# Patient Record
Sex: Male | Born: 1955 | Race: White | Hispanic: No | Smoking: Former smoker
Health system: Southern US, Community
[De-identification: ages and names within clinical notes are randomized; demographics above are authoritative.]

## PROBLEM LIST (undated history)

## (undated) DIAGNOSIS — Z973 Presence of spectacles and contact lenses: Secondary | ICD-10-CM

## (undated) DIAGNOSIS — N189 Chronic kidney disease, unspecified: Secondary | ICD-10-CM

## (undated) DIAGNOSIS — J449 Chronic obstructive pulmonary disease, unspecified: Secondary | ICD-10-CM

## (undated) DIAGNOSIS — K219 Gastro-esophageal reflux disease without esophagitis: Secondary | ICD-10-CM

## (undated) HISTORY — PX: KIDNEY STONE SURGERY: SHX686

---

## 1996-01-22 HISTORY — PX: HAND SURGERY: SHX662

## 2003-01-22 HISTORY — PX: KNEE ARTHROSCOPY: SUR90

## 2006-02-13 ENCOUNTER — Ambulatory Visit: Payer: Self-pay | Admitting: Orthopaedic Surgery

## 2007-01-22 HISTORY — PX: COLONOSCOPY: SHX174

## 2007-12-24 ENCOUNTER — Ambulatory Visit: Payer: Self-pay | Admitting: Family Medicine

## 2008-01-22 HISTORY — PX: FOOT NEUROMA SURGERY: SHX646

## 2008-06-30 ENCOUNTER — Ambulatory Visit: Payer: Self-pay | Admitting: Gastroenterology

## 2008-06-30 LAB — HM COLONOSCOPY

## 2009-04-11 ENCOUNTER — Ambulatory Visit: Payer: Self-pay | Admitting: Podiatry

## 2009-04-13 ENCOUNTER — Ambulatory Visit: Payer: Self-pay | Admitting: Podiatry

## 2010-02-19 ENCOUNTER — Ambulatory Visit: Payer: Self-pay

## 2010-05-17 ENCOUNTER — Ambulatory Visit: Payer: Self-pay | Admitting: Family Medicine

## 2011-01-22 HISTORY — PX: CERVICAL FUSION: SHX112

## 2011-01-25 ENCOUNTER — Other Ambulatory Visit: Payer: Self-pay | Admitting: Neurosurgery

## 2011-02-05 ENCOUNTER — Encounter (HOSPITAL_COMMUNITY): Payer: Self-pay | Admitting: Pharmacy Technician

## 2011-02-12 NOTE — Pre-Procedure Instructions (Addendum)
[address] Jacinto Keil  02/12/2011   Your procedure is scheduled on: 1.29.13  Report to [facility] at 530 AM.  Call this number if you have problems the morning of surgery: [phone]   Remember:   Do not eat food:After Midnight.  May have clear liquids: up to 4 Hours before arrival.  Clear liquids include soda, tea, black coffee, apple or grape juice, broth.  Take these medicines the morning of surgery with A SIP OF WATER: pepcid   Do not wear jewelry, make-up or nail polish.  Do not wear lotions, powders, or perfumes. You may wear deodorant.  Do not shave 48 hours prior to surgery.  Do not bring valuables to the hospital.  Contacts, dentures or bridgework may not be worn into surgery.  Leave suitcase in the car. After surgery it may be brought to your room.  For patients admitted to the hospital, checkout time is 11:00 AM the day of discharge.   Patients discharged the day of surgery will not be allowed to drive home.  Name and phone number of your driver:tamia [phone] fiance   Special Instructions: CHG Shower Use Special Wash: 1/2 bottle night before surgery and 1/2 bottle morning of surgery.   Please read over the following fact sheets that you were given: Pain Booklet, Coughing and Deep Breathing, MRSA Information and Surgical Site Infection Prevention

## 2011-02-13 ENCOUNTER — Encounter (HOSPITAL_COMMUNITY): Payer: Self-pay

## 2011-02-13 ENCOUNTER — Encounter (HOSPITAL_COMMUNITY)
Admission: RE | Admit: 2011-02-13 | Discharge: 2011-02-13 | Disposition: A | Discharge status: Home / Self Care | Payer: BC Managed Care – PPO | Source: Ambulatory Visit | Attending: Neurosurgery | Admitting: Neurosurgery

## 2011-02-13 HISTORY — DX: Chronic kidney disease, unspecified: N18.9

## 2011-02-13 HISTORY — DX: Gastro-esophageal reflux disease without esophagitis: K21.9

## 2011-02-13 HISTORY — DX: Chronic obstructive pulmonary disease, unspecified: J44.9

## 2011-02-13 LAB — SURGICAL PCR SCREEN: MRSA, PCR: NEGATIVE

## 2011-02-13 LAB — CBC
HCT: 45 % (ref 39.0–52.0)
Hemoglobin: 16.1 g/dL (ref 13.0–17.0)
RBC: 5.14 MIL/uL (ref 4.22–5.81)
WBC: 5.6 10*3/uL (ref 4.0–10.5)

## 2011-02-13 LAB — BASIC METABOLIC PANEL
Chloride: 104 mEq/L (ref 96–112)
GFR calc Af Amer: >90 mL/min (ref >90)
GFR calc non Af Amer: 84 mL/min — ABNORMAL LOW (ref >90)
Potassium: 4.1 mEq/L (ref 3.5–5.1)
Sodium: 140 mEq/L (ref 135–145)

## 2011-02-18 MED ORDER — CEFAZOLIN SODIUM-DEXTROSE 2-3 GM-% IV SOLR
2.0000 g | INTRAVENOUS | Status: AC
  Administered 2011-02-19: 2 g via INTRAVENOUS
  Filled 2011-02-18: qty 50

## 2011-02-19 ENCOUNTER — Inpatient Hospital Stay (HOSPITAL_COMMUNITY)
Admission: RE | Admit: 2011-02-19 | Discharge: 2011-02-21 | DRG: 864 | Disposition: A | Discharge status: Home / Self Care | Payer: BC Managed Care – PPO | Source: Ambulatory Visit | Attending: Neurosurgery | Admitting: Neurosurgery

## 2011-02-19 ENCOUNTER — Encounter (HOSPITAL_COMMUNITY)
Admission: RE | Disposition: A | Discharge status: Home / Self Care | Payer: Self-pay | Source: Ambulatory Visit | Attending: Neurosurgery

## 2011-02-19 ENCOUNTER — Ambulatory Visit (HOSPITAL_COMMUNITY): Payer: BC Managed Care – PPO

## 2011-02-19 ENCOUNTER — Encounter (HOSPITAL_COMMUNITY): Payer: Self-pay | Admitting: Anesthesiology

## 2011-02-19 ENCOUNTER — Ambulatory Visit (HOSPITAL_COMMUNITY): Payer: BC Managed Care – PPO | Admitting: Anesthesiology

## 2011-02-19 ENCOUNTER — Encounter (HOSPITAL_COMMUNITY): Payer: Self-pay | Admitting: *Deleted

## 2011-02-19 DIAGNOSIS — M5000 Cervical disc disorder with myelopathy, unspecified cervical region: Principal | ICD-10-CM | POA: Diagnosis present

## 2011-02-19 DIAGNOSIS — Z79899 Other long term (current) drug therapy: Secondary | ICD-10-CM

## 2011-02-19 DIAGNOSIS — Z01812 Encounter for preprocedural laboratory examination: Secondary | ICD-10-CM

## 2011-02-19 DIAGNOSIS — R1310 Dysphagia, unspecified: Secondary | ICD-10-CM | POA: Diagnosis not present

## 2011-02-19 DIAGNOSIS — M4712 Other spondylosis with myelopathy, cervical region: Secondary | ICD-10-CM | POA: Diagnosis present

## 2011-02-19 SURGERY — ANTERIOR CERVICAL DECOMPRESSION/DISCECTOMY FUSION 4 LEVELS
Anesthesia: General

## 2011-02-19 MED ORDER — DEXAMETHASONE SODIUM PHOSPHATE 4 MG/ML IJ SOLN
INTRAMUSCULAR | Status: DC | PRN
  Administered 2011-02-19: 10 mg via INTRAVENOUS

## 2011-02-19 MED ORDER — SODIUM CHLORIDE 0.9 % IV SOLN
INTRAVENOUS | Status: AC
  Filled 2011-02-19: qty 500

## 2011-02-19 MED ORDER — SODIUM CHLORIDE 0.9 % IR SOLN
Status: DC | PRN
  Administered 2011-02-19: 07:00:00

## 2011-02-19 MED ORDER — HYDROMORPHONE HCL PF 1 MG/ML IJ SOLN
INTRAMUSCULAR | Status: AC
  Administered 2011-02-19: 0.5 mg via INTRAVENOUS
  Filled 2011-02-19: qty 1

## 2011-02-19 MED ORDER — MORPHINE SULFATE 2 MG/ML IJ SOLN: 0.0500 mg/kg | INTRAMUSCULAR | Status: DC | PRN

## 2011-02-19 MED ORDER — ONDANSETRON HCL 4 MG/2ML IJ SOLN: 4.0000 mg | Freq: Once | INTRAMUSCULAR | Status: DC | PRN

## 2011-02-19 MED ORDER — BACITRACIN 50000 UNITS IM SOLR
INTRAMUSCULAR | Status: AC
  Administered 2011-02-19: 07:00:00
  Filled 2011-02-19: qty 1

## 2011-02-19 MED ORDER — HYDROCODONE-ACETAMINOPHEN 5-325 MG PO TABS: 1.0000 | ORAL_TABLET | ORAL | Status: DC | PRN

## 2011-02-19 MED ORDER — BUPIVACAINE HCL (PF) 0.25 % IJ SOLN
INTRAMUSCULAR | Status: DC | PRN
  Administered 2011-02-19: 5 mL

## 2011-02-19 MED ORDER — LIDOCAINE-EPINEPHRINE 1 %-1:100000 IJ SOLN
INTRAMUSCULAR | Status: DC | PRN
  Administered 2011-02-19: 5 mL

## 2011-02-19 MED ORDER — CEFAZOLIN SODIUM 1-5 GM-% IV SOLN
1.0000 g | Freq: Three times a day (TID) | INTRAVENOUS | Status: AC
  Administered 2011-02-19 (×2): 1 g via INTRAVENOUS
  Filled 2011-02-19 (×3): qty 50

## 2011-02-19 MED ORDER — DOCUSATE SODIUM 100 MG PO CAPS
100.0000 mg | ORAL_CAPSULE | Freq: Two times a day (BID) | ORAL | Status: DC
  Administered 2011-02-19 – 2011-02-21 (×4): 100 mg via ORAL
  Filled 2011-02-19 (×5): qty 1

## 2011-02-19 MED ORDER — ONDANSETRON HCL 4 MG/2ML IJ SOLN
4.0000 mg | INTRAMUSCULAR | Status: DC | PRN
  Administered 2011-02-19 – 2011-02-20 (×2): 4 mg via INTRAVENOUS
  Filled 2011-02-19 (×2): qty 2

## 2011-02-19 MED ORDER — MIDAZOLAM HCL 5 MG/5ML IJ SOLN
INTRAMUSCULAR | Status: DC | PRN
  Administered 2011-02-19: 2 mg via INTRAVENOUS

## 2011-02-19 MED ORDER — ACETAMINOPHEN 650 MG RE SUPP: 650.0000 mg | RECTAL | Status: DC | PRN

## 2011-02-19 MED ORDER — HYDROMORPHONE HCL PF 1 MG/ML IJ SOLN
0.2500 mg | INTRAMUSCULAR | Status: DC | PRN
  Administered 2011-02-19 (×2): 0.5 mg via INTRAVENOUS

## 2011-02-19 MED ORDER — LACTATED RINGERS IV SOLN
INTRAVENOUS | Status: DC | PRN
  Administered 2011-02-19: 07:00:00 via INTRAVENOUS

## 2011-02-19 MED ORDER — KCL IN DEXTROSE-NACL 20-5-0.45 MEQ/L-%-% IV SOLN
INTRAVENOUS | Status: DC
  Administered 2011-02-19 – 2011-02-20 (×3): via INTRAVENOUS
  Filled 2011-02-19 (×7): qty 1000

## 2011-02-19 MED ORDER — MEPERIDINE HCL 25 MG/ML IJ SOLN: 6.2500 mg | INTRAMUSCULAR | Status: DC | PRN

## 2011-02-19 MED ORDER — GLYCOPYRROLATE 0.2 MG/ML IJ SOLN
INTRAMUSCULAR | Status: DC | PRN
  Administered 2011-02-19: .7 mg via INTRAVENOUS

## 2011-02-19 MED ORDER — SODIUM CHLORIDE 0.9 % IJ SOLN
3.0000 mL | Freq: Two times a day (BID) | INTRAMUSCULAR | Status: DC
  Administered 2011-02-19 – 2011-02-21 (×2): 3 mL via INTRAVENOUS

## 2011-02-19 MED ORDER — MENTHOL 3 MG MT LOZG
1.0000 | LOZENGE | OROMUCOSAL | Status: DC | PRN
  Filled 2011-02-19: qty 9

## 2011-02-19 MED ORDER — SODIUM CHLORIDE 0.9 % IJ SOLN: 3.0000 mL | INTRAMUSCULAR | Status: DC | PRN

## 2011-02-19 MED ORDER — DIAZEPAM 5 MG PO TABS
5.0000 mg | ORAL_TABLET | Freq: Four times a day (QID) | ORAL | Status: DC | PRN
  Administered 2011-02-19 (×2): 5 mg via ORAL
  Filled 2011-02-19 (×2): qty 1

## 2011-02-19 MED ORDER — 0.9 % SODIUM CHLORIDE (POUR BTL) OPTIME
TOPICAL | Status: DC | PRN
  Administered 2011-02-19: 1000 mL

## 2011-02-19 MED ORDER — SUFENTANIL CITRATE 50 MCG/ML IV SOLN
INTRAVENOUS | Status: DC | PRN
  Administered 2011-02-19 (×5): 10 ug via INTRAVENOUS

## 2011-02-19 MED ORDER — SODIUM CHLORIDE 0.9 % IV SOLN: 250.0000 mL | INTRAVENOUS | Status: DC

## 2011-02-19 MED ORDER — MORPHINE SULFATE 4 MG/ML IJ SOLN
1.0000 mg | INTRAMUSCULAR | Status: DC | PRN
  Administered 2011-02-19 – 2011-02-20 (×6): 2 mg via INTRAVENOUS
  Administered 2011-02-20: 4 mg via INTRAVENOUS
  Administered 2011-02-20: 2 mg via INTRAVENOUS
  Filled 2011-02-19 (×7): qty 1

## 2011-02-19 MED ORDER — PROPOFOL 10 MG/ML IV EMUL
INTRAVENOUS | Status: DC | PRN
  Administered 2011-02-19: 200 mg via INTRAVENOUS
  Administered 2011-02-19: 30 mg via INTRAVENOUS

## 2011-02-19 MED ORDER — ACETAMINOPHEN 325 MG PO TABS: 650.0000 mg | ORAL_TABLET | ORAL | Status: DC | PRN

## 2011-02-19 MED ORDER — OXYCODONE-ACETAMINOPHEN 5-325 MG PO TABS
1.0000 | ORAL_TABLET | ORAL | Status: DC | PRN
  Administered 2011-02-19 – 2011-02-20 (×3): 2 via ORAL
  Administered 2011-02-20: 1 via ORAL
  Filled 2011-02-19: qty 2
  Filled 2011-02-19: qty 1
  Filled 2011-02-19 (×2): qty 2

## 2011-02-19 MED ORDER — MORPHINE SULFATE 10 MG/ML IJ SOLN: 0.0500 mg/kg | INTRAMUSCULAR | Status: DC | PRN

## 2011-02-19 MED ORDER — VECURONIUM BROMIDE 10 MG IV SOLR
INTRAVENOUS | Status: DC | PRN
  Administered 2011-02-19: 2 mg via INTRAVENOUS

## 2011-02-19 MED ORDER — NEOSTIGMINE METHYLSULFATE 1 MG/ML IJ SOLN
INTRAMUSCULAR | Status: DC | PRN
  Administered 2011-02-19: 5 mg via INTRAVENOUS

## 2011-02-19 MED ORDER — FAMOTIDINE 20 MG PO TABS
20.0000 mg | ORAL_TABLET | Freq: Once | ORAL | Status: AC
  Administered 2011-02-19: 20 mg via ORAL
  Filled 2011-02-19 (×2): qty 1

## 2011-02-19 MED ORDER — THROMBIN 20000 UNITS EX KIT
PACK | CUTANEOUS | Status: DC | PRN
  Administered 2011-02-19: 20000 [IU] via TOPICAL

## 2011-02-19 MED ORDER — ONDANSETRON HCL 4 MG/2ML IJ SOLN
INTRAMUSCULAR | Status: DC | PRN
  Administered 2011-02-19: 4 mg via INTRAVENOUS

## 2011-02-19 MED ORDER — PHENOL 1.4 % MT LIQD
1.0000 | OROMUCOSAL | Status: DC | PRN
  Filled 2011-02-19: qty 177

## 2011-02-19 MED ORDER — ZOLPIDEM TARTRATE 5 MG PO TABS: 10.0000 mg | ORAL_TABLET | Freq: Every evening | ORAL | Status: DC | PRN

## 2011-02-19 MED ORDER — ROCURONIUM BROMIDE 100 MG/10ML IV SOLN
INTRAVENOUS | Status: DC | PRN
  Administered 2011-02-19: 20 mg via INTRAVENOUS
  Administered 2011-02-19: 10 mg via INTRAVENOUS
  Administered 2011-02-19 (×2): 50 mg via INTRAVENOUS
  Administered 2011-02-19: 10 mg via INTRAVENOUS

## 2011-02-19 SURGICAL SUPPLY — 71 items
ALPHATEC SPINE PLATE 80 MM (Plate) ×2 IMPLANT
BAG DECANTER FOR FLEXI CONT (MISCELLANEOUS) ×2 IMPLANT
BANDAGE GAUZE ELAST BULKY 4 IN (GAUZE/BANDAGES/DRESSINGS) IMPLANT
BENZOIN TINCTURE PRP APPL 2/3 (GAUZE/BANDAGES/DRESSINGS) ×2 IMPLANT
BIT DRILL 14MM (INSTRUMENTS) ×1 IMPLANT
BIT DRILL NEURO 2X3.1 SFT TUCH (MISCELLANEOUS) ×1 IMPLANT
BLADE ULTRA TIP 2M (BLADE) IMPLANT
BLOCK PROFUSE SZ 6 (Bone Implant) ×4 IMPLANT
BUR BARREL STRAIGHT FLUTE 4.0 (BURR) ×2 IMPLANT
CAGE CERVICAL 8 (Cage) ×4 IMPLANT
CANISTER SUCTION 2500CC (MISCELLANEOUS) ×2 IMPLANT
CLOTH BEACON ORANGE TIMEOUT ST (SAFETY) ×2 IMPLANT
CONT SPEC 4OZ CLIKSEAL STRL BL (MISCELLANEOUS) ×2 IMPLANT
COVER MAYO STAND STRL (DRAPES) ×2 IMPLANT
DERMABOND ADVANCED (GAUZE/BANDAGES/DRESSINGS) ×1
DERMABOND ADVANCED .7 DNX12 (GAUZE/BANDAGES/DRESSINGS) ×1 IMPLANT
DRAPE LAPAROTOMY 100X72 PEDS (DRAPES) ×2 IMPLANT
DRAPE MICROSCOPE LEICA (MISCELLANEOUS) ×2 IMPLANT
DRAPE POUCH INSTRU U-SHP 10X18 (DRAPES) ×2 IMPLANT
DRAPE PROXIMA HALF (DRAPES) IMPLANT
DRESSING TELFA 8X3 (GAUZE/BANDAGES/DRESSINGS) ×2 IMPLANT
DRILL 14MM (INSTRUMENTS) ×2
DRILL NEURO 2X3.1 SOFT TOUCH (MISCELLANEOUS) ×2
DURAPREP 6ML APPLICATOR 50/CS (WOUND CARE) ×2 IMPLANT
ELECT COATED BLADE 2.86 ST (ELECTRODE) ×2 IMPLANT
ELECT REM PT RETURN 9FT ADLT (ELECTROSURGICAL) ×2
ELECTRODE REM PT RTRN 9FT ADLT (ELECTROSURGICAL) ×1 IMPLANT
GAUZE SPONGE 4X4 16PLY XRAY LF (GAUZE/BANDAGES/DRESSINGS) ×2 IMPLANT
GLOVE BIO SURGEON STRL SZ8 (GLOVE) ×2 IMPLANT
GLOVE BIOGEL M 8.0 STRL (GLOVE) ×2 IMPLANT
GLOVE BIOGEL PI IND STRL 8 (GLOVE) ×1 IMPLANT
GLOVE BIOGEL PI IND STRL 8.5 (GLOVE) ×2 IMPLANT
GLOVE BIOGEL PI INDICATOR 8 (GLOVE) ×1
GLOVE BIOGEL PI INDICATOR 8.5 (GLOVE) ×2
GLOVE ECLIPSE 7.5 STRL STRAW (GLOVE) ×2 IMPLANT
GLOVE EXAM NITRILE LRG STRL (GLOVE) IMPLANT
GLOVE EXAM NITRILE MD LF STRL (GLOVE) ×2 IMPLANT
GLOVE EXAM NITRILE XL STR (GLOVE) IMPLANT
GLOVE EXAM NITRILE XS STR PU (GLOVE) IMPLANT
GOWN BRE IMP SLV AUR LG STRL (GOWN DISPOSABLE) IMPLANT
GOWN BRE IMP SLV AUR XL STRL (GOWN DISPOSABLE) IMPLANT
GOWN STRL REIN 2XL LVL4 (GOWN DISPOSABLE) IMPLANT
HEAD HALTER (SOFTGOODS) ×2 IMPLANT
KIT BASIN OR (CUSTOM PROCEDURE TRAY) ×2 IMPLANT
KIT ROOM TURNOVER OR (KITS) ×2 IMPLANT
NEEDLE HYPO 18GX1.5 BLUNT FILL (NEEDLE) ×2 IMPLANT
NEEDLE HYPO 25X1 1.5 SAFETY (NEEDLE) ×2 IMPLANT
NEEDLE SPNL 22GX3.5 QUINCKE BK (NEEDLE) ×2 IMPLANT
NS IRRIG 1000ML POUR BTL (IV SOLUTION) ×2 IMPLANT
PACK LAMINECTOMY NEURO (CUSTOM PROCEDURE TRAY) ×2 IMPLANT
PAD ARMBOARD 7.5X6 YLW CONV (MISCELLANEOUS) ×2 IMPLANT
PATTIES SURGICAL .5 X.5 (GAUZE/BANDAGES/DRESSINGS) ×2 IMPLANT
PIN DISTRACTION 14MM (PIN) ×4 IMPLANT
PUREGEN 2.0ML LRG (Bone Implant) ×4 IMPLANT
RUBBERBAND STERILE (MISCELLANEOUS) ×4 IMPLANT
SCREW 14MM (Screw) ×20 IMPLANT
SPACER SPNL 7D LRG 16X14X8XNS (Cage) ×4 IMPLANT
SPCR SPNL 7D LRG 16X14X8XNS (Cage) ×4 IMPLANT
SPONGE GAUZE 4X4 12PLY (GAUZE/BANDAGES/DRESSINGS) ×2 IMPLANT
SPONGE INTESTINAL PEANUT (DISPOSABLE) ×2 IMPLANT
SPONGE SURGIFOAM ABS GEL 100 (HEMOSTASIS) ×2 IMPLANT
STAPLER SKIN PROX WIDE 3.9 (STAPLE) ×2 IMPLANT
STRIP CLOSURE SKIN 1/2X4 (GAUZE/BANDAGES/DRESSINGS) ×2 IMPLANT
SUT VIC AB 3-0 SH 8-18 (SUTURE) ×4 IMPLANT
SYR 20ML ECCENTRIC (SYRINGE) ×2 IMPLANT
SYR 3ML LL SCALE MARK (SYRINGE) ×2 IMPLANT
TOWEL OR 17X24 6PK STRL BLUE (TOWEL DISPOSABLE) ×2 IMPLANT
TOWEL OR 17X26 10 PK STRL BLUE (TOWEL DISPOSABLE) ×2 IMPLANT
TRAP SPECIMEN MUCOUS 40CC (MISCELLANEOUS) ×2 IMPLANT
TRAY FOLEY CATH 14FRSI W/METER (CATHETERS) ×2 IMPLANT
WATER STERILE IRR 1000ML POUR (IV SOLUTION) ×2 IMPLANT

## 2011-02-19 NOTE — Anesthesia Preprocedure Evaluation (Addendum)
Anesthesia Evaluation  Patient identified by MRN, date of birth, ID band Patient awake    Reviewed: Allergy & Precautions, H&P , NPO status , Patient's Chart, lab work & pertinent test results  Airway Mallampati: I TM Distance: >3 FB Neck ROM: Full  Mouth opening: Limited Mouth Opening  Dental  (+) Teeth Intact and Dental Advisory Given   Pulmonary neg pulmonary ROS,  Pt does not have COPD clear to auscultation        Cardiovascular neg cardio ROS Regular Normal    Neuro/Psych    GI/Hepatic GERD-  Medicated and Controlled,  Endo/Other    Renal/GU      Musculoskeletal   Abdominal   Peds  Hematology   Anesthesia Other Findings   Reproductive/Obstetrics                         Anesthesia Physical Anesthesia Plan  ASA: II  Anesthesia Plan: General   Post-op Pain Management:    Induction: Intravenous  Airway Management Planned: Oral ETT  Additional Equipment:   Intra-op Plan:   Post-operative Plan: Extubation in OR  Informed Consent: I have reviewed the patients History and Physical, chart, labs and discussed the procedure including the risks, benefits and alternatives for the proposed anesthesia with the patient or authorized representative who has indicated his/her understanding and acceptance.   Dental advisory given  Plan Discussed with: CRNA, Anesthesiologist and Surgeon  Anesthesia Plan Comments:         Anesthesia Quick Evaluation

## 2011-02-19 NOTE — Transfer of Care (Signed)
Immediate Anesthesia Transfer of Care Note  Patient: Chris Odom  Procedure(s) Performed:  ANTERIOR CERVICAL DECOMPRESSION/DISCECTOMY FUSION 4 LEVELS - CERVICAL THREE-FOUR CERVICAL FOUR-FIVE CERVICAL FIVE-SIX CERVICAL SIX-SEVEN anterior cervical decompression with fusion interbody prothesis plating and bonegraft  Patient Location: PACU  Anesthesia Type: General  Level of Consciousness: sedated  Airway & Oxygen Therapy: Patient Spontanous Breathing and Patient connected to nasal cannula oxygen  Post-op Assessment: Report given to PACU RN, Post -op Vital signs reviewed and stable and Patient moving all extremities X 4  Post vital signs: Reviewed and stable  Complications: No apparent anesthesia complications

## 2011-02-19 NOTE — Interval H&P Note (Signed)
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 After consideration of risks, benefits and other options for treatment, the patient has consented to  Procedure(s): ANTERIOR CERVICAL DECOMPRESSION/DISCECTOMY FUSION 4 LEVELS as a surgical intervention .  The patients' history has been reviewed, patient examined, no change in status, stable for surgery.  I have reviewed the patients' chart and labs.  Questions were answered to the patient's satisfaction.     Jeremey Bascom D  Date of Initial H&P:01/25/2011  History reviewed, patient examined, no change in status, stable for surgery.

## 2011-02-19 NOTE — H&P (Signed)
Chris Odom  #[id number] DOB:  02-26-1955 01/25/2011:  Chris Odom returns today to review his MRI of his cervical spine.  This shows that he has marked spondylosis, biforaminal stenosis, and canal stenosis with cord compression at the C5-6 and C6-7 levels, with retrolisthesis of C3 on C4 and with biforaminal stenosis C3-4 and C4-5 levels, all of which are marked and quite impressive.  He does note persistent numbness into both his hands when he tips his head back and he says that he is having more severe pain than he was previously.    I reviewed in great detail his imaging studies with him and explained that he would need to undergo decompression and fusion at C3 through 7 levels if he were to elect for surgical treatment. I do not think that one level is not severely affected and I think that he would need to have each of these levels treated. We discussed at length the specifics of the surgery, attendant risks and benefits.  He asked why this has happened to him and the one thing of note in his history is that he used to smoke two to three packs a day and did so for many years, and I think this is a likely result of accelerated marked spondylosis.   We went over models. I went over his MRI. I fitted him for a cervical collar and answered his questions in detail.  He wishes to proceed with surgery.          Danae Odom. Venetia Maxon, M.D./aft     NEUROSURGICAL CONSULTATION   Chris Odom  DOB:  02/02/1955 #[id number]    January 04, 2011   HISTORY:     Chris Odom is a 56 year old retired Engineer, maintenance ([employer]) who is currently working at [employer] on a part-time basis, who presents with left arm and shoulder pain.  He describes numbness and tingling into his left arm. He says this has been going on for about six weeks.  It began with left scapular pain about six weeks ago and now it is extending down his left arm along with numbness.  He has been taking three Celebrex a day along with muscle relaxers, which he  says helps him some, but not a great deal.  He has also been taking Advil two a day.  He has had a cyst removed x three from his right hand and left knee x two and neuromas removed from his right foot.  He says that he was initially in a lot of pain, but now the pain has settled into his shoulders and notes tingling and numbness into his arm, and he says he cannot use his arm. He also notes numbness into his upper chest as well and this involves his second through fifth digits.    REVIEW OF SYSTEMS:   A detailed Review of Systems sheet was reviewed with the patient.  Pertinent positives include wears glasses, arm pain and neck pain.  All other systems are negative; this includes Constitutional symptoms, Cardiovascular, Ears, nose, mouth, throat, Endocrine, Respiratory, Gastrointestinal, Genitourinary, Integumentary & Breast, Neurologic, Psychiatric, Hematologic/Lymphatic, Allergic/Immunologic.    PAST MEDICAL HISTORY:      Current Medical Conditions:    As previously described.      Prior Operations and Hospitalizations:   As previously described.      Medications and Allergies:  Medications - Advil two per day.  No known drug allergies.      Height and Weight:     He  is currently 5'11" tall, 198 lbs.    SOCIAL HISTORY:    He denies tobacco, alcohol, or drug use.     DIAGNOSTIC STUDIES:   Chris Odom had cervical radiographs which show left C4-5 stenosis and also at C5-6.    PHYSICAL EXAMINATION:      General Appearance:   On examination today, Chris Odom is a pleasant and cooperative man in no acute distress.      Blood Pressure, Pulse:     His blood pressure is 108/64.  Heart rate is 70 and regular.  Respiratory rate is 16.      HEENT - normocephalic, atraumatic.  The pupils are equal, round and reactive to light.  The extraocular muscles are intact.  Sclerae - white.  Conjunctiva - pink.  Oropharynx benign.  Uvula midline.     Neck - there are no masses, meningismus, deformities, tracheal  deviation, jugular vein distention or carotid bruits.  He has decreased range of motion of his neck with lateral bending and rotation to the left.  He has a positive Spurlings' maneuver to the left, negative to the right.  Negative Lhermitte's sign with axial compression.      Respiratory - there is normal respiratory effort with good intercostal function.  Lungs are clear to auscultation.  There are no rales, rhonchi or wheezes.      Cardiovascular - the heart has regular rate and rhythm to auscultation.  No murmurs are appreciated.  There is no extremity edema, cyanosis or clubbing.  There are palpable pedal pulses.      Abdomen - soft, nontender, no hepatosplenomegaly appreciated or masses.  There are active bowel sounds.  No guarding or rebound.      Musculoskeletal Examination - he has mildly positive shoulder impingement testing on the left, negative on the right.    NEUROLOGICAL EXAMINATION: The patient is oriented to time, person and place and has good recall of both recent and remote memory with normal attention span and concentration.  The patient speaks with clear and fluent speech and exhibits normal language function and appropriate fund of knowledge.      Cranial Nerve Examination - pupils are equal, round and reactive to light.  Extraocular movements are full.  Visual fields are full to confrontational testing.  Facial sensation and facial movement are symmetric and intact.  Hearing is intact to finger rub.  Palate is upgoing.  Shoulder shrug is symmetric.  Tongue protrudes in the midline.      Motor Examination - motor strength is 5/5 in the bilateral deltoids, biceps, handgrips, wrist extensors, interosseous, 5/5 right triceps, left triceps strength at 4/5, and left wrist flexion 4/5.  In the lower extremities motor strength is 5/5 in hip flexion, extension, quadriceps, hamstrings, plantar flexion, dorsiflexion and extensor hallucis longus.      Sensory Examination - he has decreased  pin sensation in the left second and third digits.       Deep Tendon Reflexes - 1 in the biceps, 1 in the right triceps, trace in the left triceps and 2 in the knees, 2 in the ankles.  The great toes are downgoing to plantar stimulation.      Cerebellar Examination - normal coordination in upper and lower extremities and normal rapid alternating movements.  Romberg test is negative.    IMPRESSION AND RECOMMENDATIONS: Edvardo Honse is a 56 year old man with cervical spondylosis and left arm pain and weakness.  He has evidence of a C4 radiculopathy on physical examination.  I have recommended that he undergo an MRI in an expedited fashion of his cervical spine. I will see him back after that has been done and make further recommendations at that point.    [facility]    Danae Odom. Venetia Maxon, M.D.  JDS:aft

## 2011-02-19 NOTE — Anesthesia Postprocedure Evaluation (Signed)
  Anesthesia Post-op Note  Patient: Chris Odom  Procedure(s) Performed:  ANTERIOR CERVICAL DECOMPRESSION/DISCECTOMY FUSION 4 LEVELS - CERVICAL THREE-FOUR CERVICAL FOUR-FIVE CERVICAL FIVE-SIX CERVICAL SIX-SEVEN anterior cervical decompression with fusion interbody prothesis plating and bonegraft  Patient Location: PACU  Anesthesia Type: General  Level of Consciousness: awake and alert   Airway and Oxygen Therapy: Patient Spontanous Breathing and Patient connected to nasal cannula oxygen  Post-op Pain: mild  Post-op Assessment: Post-op Vital signs reviewed, Patient's Cardiovascular Status Stable, Respiratory Function Stable, Patent Airway, No signs of Nausea or vomiting and Pain level controlled  Post-op Vital Signs: Reviewed and stable  Complications: No apparent anesthesia complications

## 2011-02-19 NOTE — Op Note (Signed)
02/19/2011  11:23 AM  PATIENT:  Chris Odom  56 y.o. 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 cervical degenerative disc disease cervical radiculopathy C3/4, 4/5, 5/6, 6/7  PROCEDURE:  Procedure(s): ANTERIOR CERVICAL DECOMPRESSION/DISCECTOMY FUSION 4 LEVELS C3/4, 4/5, 5/6, 6/7   SURGEON:  Surgeon(s): Dorian Heckle, MD Karn Cassis, MD  PHYSICIAN ASSISTANT:   ASSISTANTS: Poteat, RN   ANESTHESIA:   general  EBL:  Total I/O In: 2500 [I.V.:2500] Out: 365 [Urine:315; Blood:50]  BLOOD ADMINISTERED:none  DRAINS: none and (10) Jackson-Pratt drain(s) with closed bulb suction in the prevertebral   LOCAL MEDICATIONS USED:  LIDOCAINE 10CC  SPECIMEN:  No Specimen  DISPOSITION OF SPECIMEN:  N/A  COUNTS:  YES  TOURNIQUET:  * No tourniquets in log *  DICTATION: Patient was brought to operating room and following the smooth and uncomplicated induction of general endotracheal anesthesia his head was placed on a horseshoe head holder he was placed in 5 pounds of Holter traction and his anterior neck was prepped and draped in usual sterile fashion. An incision was made on the left side of midline after infiltrating the skin and subcutaneous tissues with local lidocaine. The platysmal layer was incised and subplatysmal dissection was performed exposing the anterior border sternocleidomastoid muscle. Using blunt dissection the carotid sheath was kept lateral and trachea and esophagus kept medial exposing the anterior cervical spine. A bent spinal needle was placed it was felt to be the C5-6 level and C3-4 level and this was confirmed on intraoperative x-ray. Longus coli muscles were taken down from the anterior cervical spine using electrocautery and key elevator and  self-retaining retractor was placed. The interspaces at C3-4, C4-5, C5-6, and C6-7 were incised and a thorough discectomy was performed. Distraction pins were placed. A large ventral osteophyte was removed at C4 and C5. Uncinate spurs and central spondylitic ridges were drilled down with a high-speed drill. The spinal cord dura and both nerve roots at each level were widely decompressed. Hemostasis was assured. After trial sizing  8 mm peek interbody cages were selected and packed with profuse block and autograft. Tamped into position and countersunk appropriately. This was done sequentially at each level.Distraction weight was removed. An 80 mm trestle luxe anterior cervical plate was affixed to the cervical spine with 14 mm variable-angle screws 2 at C3, 2 at C4, 2 at C5, 2 at C6, and and 2 at C7. All screws were well-positioned and locking mechanisms were engaged. Soft tissues were inspected and found to be in good repair. The wound was irrigated. #10 JP drain was inserted in the prevertebral space through a separate stab incision and anchored with a 3-0 Vicryl stitch. The platysma layer was closed with 3-0 Vicryl stitches and the skin was reapproximated with 3-0 Vicryl subcuticular stitches. The wound was dressed with benzoin and Steri-Strips. Counts were correct at the end of the case. Patient was extubated and taken to recovery in stable and satisfactory condition.  PLAN OF CARE: Admit for overnight observation  PATIENT DISPOSITION:  PACU - hemodynamically stable.   Delay start of Pharmacological VTE agent (>24hrs) due to surgical blood loss or risk of bleeding:  YES

## 2011-02-19 NOTE — Progress Notes (Signed)
Subjective: Patient reports "My neck hurts. She just gave me some pain medicine."  Objective: Vital signs in last 24 hours: Temp:  [97 F (36.1 C)-97.9 F (36.6 C)] 97 F (36.1 C) (01/29 1338) Pulse Rate:  [54-89] 74  (01/29 1338) Resp:  [6-22] 18  (01/29 1338) BP: (127-148)/(73-85) 143/78 mmHg (01/29 1312) SpO2:  [93 %-98 %] 98 % (01/29 1338)  Intake/Output from previous day:   Intake/Output this shift: Total I/O In: 2800 [I.V.:2500; Other:300] Out: 365 [Urine:315; Blood:50]  Post-op Note.Marland KitchenMarland KitchenAlert, conversant. Family in room. Good strength BUE, hand intrinsics. Pain posterior neck, bilat. trapezeii as expected. JP patent. Drsg intact, dry.   Lab Results: No results found for this basename: WBC:2,HGB:2,HCT:2,PLT:2 in the last 72 hours BMET No results found for this basename: NA:2,K:2,CL:2,CO2:2,GLUCOSE:2,BUN:2,CREATININE:2,CALCIUM:2 in the last 72 hours  Studies/Results: Dg Cervical Spine 2-3 Views  02/19/2011  *RADIOLOGY REPORT*  Clinical Data: C3-C7 ACDF.  CERVICAL SPINE - 2-3 VIEW  Comparison: Cervical MRI 01/07/2011 and radiographs 01/04/2011.  Findings: Two cross-table lateral views of the cervical spine are submitted postoperatively from the operating room.  Image #1 at 0810 hours demonstrates anterior needle localization of the C4 vertebral body and C5-C6 disc space.  Image #2 at 1100 hours demonstrates interval anterior discectomy and fusion from C3-C7.  There is limited visualization of the plate inferior to the C6-C7 disc space.  Anterior plate and intervertebral bone plugs appear well positioned.  Surgical sponges are present anteriorly in the surgical bed.  IMPRESSION: Intraoperative views during C3-C7 ACDF.  No demonstrated complication.  Original Report Authenticated By: Gerrianne Scale, M.D.    Assessment/Plan: Improved.   LOS: 0 days  Mobilize gradually; will assess drain in AM.   Chris Odom 02/19/2011, 4:03 PM

## 2011-02-20 ENCOUNTER — Inpatient Hospital Stay (HOSPITAL_COMMUNITY): Payer: BC Managed Care – PPO

## 2011-02-20 MED ORDER — DEXAMETHASONE SODIUM PHOSPHATE 4 MG/ML IJ SOLN
10.0000 mg | Freq: Once | INTRAMUSCULAR | Status: DC
  Filled 2011-02-20: qty 2.5

## 2011-02-20 MED ORDER — ALPRAZOLAM 0.25 MG PO TABS
0.1250 mg | ORAL_TABLET | Freq: Four times a day (QID) | ORAL | Status: DC | PRN
  Administered 2011-02-20: 0.125 mg via ORAL
  Filled 2011-02-20: qty 1

## 2011-02-20 MED ORDER — CYCLOBENZAPRINE HCL 10 MG PO TABS
10.0000 mg | ORAL_TABLET | Freq: Three times a day (TID) | ORAL | Status: DC | PRN
  Administered 2011-02-20 (×2): 10 mg via ORAL
  Filled 2011-02-20 (×2): qty 1

## 2011-02-20 MED ORDER — DEXAMETHASONE SODIUM PHOSPHATE 10 MG/ML IJ SOLN
10.0000 mg | Freq: Once | INTRAMUSCULAR | Status: AC
  Administered 2011-02-20: 10 mg via INTRAVENOUS

## 2011-02-20 MED ORDER — DEXAMETHASONE SODIUM PHOSPHATE 4 MG/ML IJ SOLN
4.0000 mg | Freq: Four times a day (QID) | INTRAMUSCULAR | Status: DC
  Administered 2011-02-20 – 2011-02-21 (×4): 4 mg via INTRAVENOUS
  Filled 2011-02-20 (×8): qty 1

## 2011-02-20 MED ORDER — ALUM & MAG HYDROXIDE-SIMETH 200-200-20 MG/5ML PO SUSP
15.0000 mL | ORAL | Status: DC | PRN
  Administered 2011-02-20: 15 mL via ORAL
  Filled 2011-02-20: qty 30

## 2011-02-20 NOTE — Plan of Care (Signed)
Problem: Consults Goal: Diagnosis - Spinal Surgery Cervical Spine Fusion     

## 2011-02-20 NOTE — Evaluation (Signed)
Physical Therapy Evaluation Patient Details Name: Chris Odom MRN: [id number] DOB: 1955-02-15 Today's Date: 02/20/2011  Problem List: There is no problem list on file for this patient.   Past Medical History:  Past Medical History  Diagnosis Date  . Chronic kidney disease     hx stones  . GERD (gastroesophageal reflux disease)   . COPD (chronic obstructive pulmonary disease)     a little -due to smoking- no problems recently  . Angina    Past Surgical History:  Past Surgical History  Procedure Date  . Foot neuroma surgery 10    rt  . Hand surgery 98    rt hand gangion, wrist ganglion  . Knee arthroscopy 05    ganglion cyst  . Kidney stone surgery     cysto  . Cervical fusion     C 3 -C7    PT Assessment/Plan/Recommendation PT Assessment Clinical Impression Statement: Pt tolerated mobility well, educated him on proper postural alignment, do not expect him to need PT after d/c until outpt appropriate.  Pt was very anxious with throat tightening, nsg notified and awaiting steroid.  See note at end of eval for events that occurred at end of session.  PT will follow acutely. PT Recommendation/Assessment: Patient will need skilled PT in the acute care venue PT Problem List: Decreased balance;Decreased knowledge of precautions;Pain Problem List Comments: Expect mild balance deficit due to pain meds, expect resolution with decreased pain meds before d/c Barriers to Discharge: None PT Therapy Diagnosis : Acute pain PT Plan PT Frequency: Min 5X/week PT Treatment/Interventions: Gait training;Stair training;Therapeutic activities;Therapeutic exercise;Balance training;Patient/family education PT Recommendation Recommendations for Other Services: OT consult Follow Up Recommendations: No PT follow up Equipment Recommended: None recommended by PT PT Goals  Acute Rehab PT Goals PT Goal Formulation: With patient/family Time For Goal Achievement: 7 days Pt will go Supine/Side to  Sit: with modified independence PT Goal: Supine/Side to Sit - Progress: Goal set today Pt will go Sit to Supine/Side: with modified independence PT Goal: Sit to Supine/Side - Progress: Goal set today Pt will go Sit to Stand: with modified independence PT Goal: Sit to Stand - Progress: Goal set today Pt will go Stand to Sit: with modified independence PT Goal: Stand to Sit - Progress: Goal set today Pt will Ambulate: >150 feet;with modified independence PT Goal: Ambulate - Progress: Goal set today Pt will Go Up / Down Stairs: Flight;with modified independence;with rail(s) PT Goal: Up/Down Stairs - Progress: Goal set today Additional Goals Additional Goal #1: Pt will explain and demonstrate proper neck posture throughout mobility PT Goal: Additional Goal #1 - Progress: Goal set today  PT Evaluation Precautions/Restrictions  Precautions Precautions: Other (comment) (cervical) Required Braces or Orthoses: Yes Cervical Brace: Hard collar Restrictions Weight Bearing Restrictions: No Prior Functioning  Home Living Lives With: Alone;Other (Comment) (daughter and friend to stay with him as needed after sx) Receives Help From: Family;Friend(s) Type of Home: House Home Layout: Two level Alternate Level Stairs-Rails: Left (2 rails (reachable) bottom half) Alternate Level Stairs-Number of Steps: flight Home Access: Stairs to enter Entrance Stairs-Rails: Right Entrance Stairs-Number of Steps: 10 Home Adaptive Equipment: None Prior Function Level of Independence: Independent with basic ADLs;Independent with homemaking with ambulation;Independent with gait;Independent with transfers Able to Take Stairs?: Reciprically Driving: Yes Vocation: Part time employment Vocation Requirements: works part-time at [employer]: Hobbies-yes (Comment) Comments: golf Cognition Cognition Arousal/Alertness: Awake/alert Overall Cognitive Status: Appears within functional limits for tasks  assessed Orientation Level: Oriented X4 Cognition -  Other Comments: pt anxious due to swelling in throat, having difficulty swallowing and feels like he is having difficulty breathing when feeling panicked.  O2 sats checked, 90-91% on RA Sensation/Coordination Sensation Light Touch: Appears Intact (bilateral UE's improved sensation since surgery) Stereognosis: Appears Intact Hot/Cold: Not tested Proprioception: Appears Intact Coordination Gross Motor Movements are Fluid and Coordinated: Yes Fine Motor Movements are Fluid and Coordinated: Yes Extremity Assessment RUE Assessment RUE Assessment: Within Functional Limits LUE Assessment LUE Assessment: Within Functional Limits RLE Assessment RLE Assessment: Within Functional Limits LLE Assessment LLE Assessment: Within Functional Limits Mobility (including Balance) Bed Mobility Bed Mobility: Yes Sit to Supine: 4: Min assist;HOB elevated (comment degrees) (elevated 60 degrees to decrease neck pain) Transfers Transfers: Yes Sit to Stand: 4: Min assist (min-guard A) Sit to Stand Details (indicate cue type and reason): min-guard A for safety Stand to Sit: 4: Min assist (min-guard A) Stand to Sit Details: min-guard A for safety, performed sit-stand transfer 4x Ambulation/Gait Ambulation/Gait: Yes Ambulation/Gait Assistance: 4: Min assist (min-guard A) Ambulation/Gait Assistance Details (indicate cue type and reason): min-guard A given due to pt slightly "woozy" from pain meds. Pt pushed IV pole for part of ambulation but this made his neck more uncomfortable so therapist pushed pole.  Pt just as steady without pole as with it.  Pt experienced less anxiety with ambulation than when sitting in room. (vc's for upright posture) Ambulation Distance (Feet): 200 Feet Assistive device: None Gait Pattern: Step-through pattern;Trunk flexed Gait velocity: WFL Stairs: Yes Stairs Assistance: 5: Supervision Stair Management Technique: Two  rails;Alternating pattern;Forwards Number of Stairs: 10  Height of Stairs: 6  Wheelchair Mobility Wheelchair Mobility: No  Posture/Postural Control Posture/Postural Control: No significant limitations Balance Balance Assessed: Yes Dynamic Standing Balance Dynamic Standing - Balance Support: No upper extremity supported Dynamic Standing - Level of Assistance: 5: Stand by assistance Exercise    End of Session PT - End of Session Equipment Utilized During Treatment: Gait belt;Cervical collar Activity Tolerance: Patient limited by pain;Treatment limited secondary to agitation Patient left: in chair;with call bell in reach;with family/visitor present (see note below) Nurse Communication: Mobility status for ambulation;Mobility status for transfers General Behavior During Session: Agitated Cognition: WFL for tasks performed Note: At end of session, pt was seated in straight back chair, daughter present.  Pt's O2 sats were 90%, pt began to feel nauseous and began to vomit.  Daughter beside him with basin, this therapist went to fine nurse to request Zophran (which he had taken for earlier for previous episode of vomiting).  Daughter began to yell for therapist, therapist re-entered room and pt had passed out, daughter holding him up, he was on his knees on the floor.  Therapist called code and assistance arrived. As assistance arrived, pt became coherent again, sat up suddenly and asked, "what happened?"  Pt reports he has had vagal episodes in the past on toilet.  Rapid response present from that point forward.  Therapist remained until pt stable in the bed with nsg.  Pt transferred to ICU for observation. [place], [place] [phone] 02/20/2011, 11:03 AM

## 2011-02-20 NOTE — Progress Notes (Signed)
Utilization review completed. Alysandra Lobue, RN, BSN. 02/20/11  

## 2011-02-20 NOTE — Progress Notes (Signed)
Notified Dr. Venetia Maxon of patient statement that his throat is dry/in pain. Breath sounds are clear and equal, no stridor on auscultation.  New orders obtained.

## 2011-02-20 NOTE — Progress Notes (Signed)
OT Cancellation Note  Treatment cancelled today due to:  Pt with syncopal episode in chair with rapid response called. Pt now transferring off floor to ICU. OT to await further orders for activity. OT to hold evaluation .   Harrel Carina [place]   OTR/L Pager: [phone] Office: [phone] .

## 2011-02-20 NOTE — Progress Notes (Signed)
Called at 0910 to Code Blue call to room 3016.  Code Blue cancelled on arrival to unit.  Went in to see patient.  Staff report patient with nausea during night and some c/o of difficulty swallowing.  Patient had worked with PT - was sitting in chair - c/o nausea - PT staff had gone to seek out RN for anti-nausea med's when patients daughter reported patient stated he felt light- headed and passed out - daughter was able to lower patient safely to knees - did not fall- staff arrived and found patient diaphoretic - decreased LOC - pulse present - resps ok - patient quickly was arousable - stood to bed - on our arrival patient sitting on edge bed - alert but drowsy - diaphoretic - answers questions - denies chest pain - SOB - able to speak without problem -patient reports voice is not normal  - no stridor noted -voice not wet sounding -   DDI to anterior neck -   JP present bulb 2/3 full with serousang drainage - Neck brace on - BP 151/96 HR 104 RR 22 O2 sat 97% on RA.  Bil BS present - clear.  Patient quickly became alert - asking what happened - he remembers feeling nausea then dizzy - cont to deny SOB or chest pain.  No calf pain.  Dr. Venetia Maxon notified - updated on status.  Orders given.  1610 - Patient states he feels fine now - Decadron given per RN .  Repeat VS - 122/86 HR 80 RR 18 O2 sat 96%  On RA. 0950 O2 sat monitor alarm - patient sleeping - daughter states periods of apnea with sleep during night  - patient easily awakens - resps reg and unlabored - placed on heart monitor - patient states he does snore at home - never been dx sleep apnea.  9604 Patient continues to endorse trouble swallowing - remains with clear auscultation upper airway and lungs. Speech without change.  JP with no further drainage.  Report to [facility] on 3100.  Patient transported to 3107 via bed with heart monitor.  Patient states he feels throat is worse - very dry - NAD - VSS.  Handoff report to [facility] completed. Daughter supported.

## 2011-02-20 NOTE — Progress Notes (Signed)
Subjective: Patient reports discomfort swallowing and pain between shoulders  Objective: Vital signs in last 24 hours: Temp:  [97 F (36.1 C)-98.7 F (37.1 C)] 97.6 F (36.4 C) (01/30 0200) Pulse Rate:  [65-94] 94  (01/30 0200) Resp:  [6-22] 18  (01/30 0200) BP: (128-148)/(70-89) 130/70 mmHg (01/30 0200) SpO2:  [93 %-98 %] 96 % (01/30 0200) Weight:  [95.89 kg (211 lb 6.4 oz)] 95.89 kg (211 lb 6.4 oz) (01/29 2238)  Intake/Output from previous day: 01/29 0701 - 01/30 0700 In: 3163 [P.O.:360; I.V.:2503] Out: 2215 [Urine:2115; Drains:50; Blood:50] Intake/Output this shift:    Physical Exam: Full strength both upper extremities all groups, dysphagia, speech intact. Incision CDI  Lab Results: No results found for this basename: WBC:2,HGB:2,HCT:2,PLT:2 in the last 72 hours BMET No results found for this basename: NA:2,K:2,CL:2,CO2:2,GLUCOSE:2,BUN:2,CREATININE:2,CALCIUM:2 in the last 72 hours  Studies/Results: Dg Cervical Spine 2-3 Views  02/19/2011  *RADIOLOGY REPORT*  Clinical Data: C3-C7 ACDF.  CERVICAL SPINE - 2-3 VIEW  Comparison: Cervical MRI 01/07/2011 and radiographs 01/04/2011.  Findings: Two cross-table lateral views of the cervical spine are submitted postoperatively from the operating room.  Image #1 at 0810 hours demonstrates anterior needle localization of the C4 vertebral body and C5-C6 disc space.  Image #2 at 1100 hours demonstrates interval anterior discectomy and fusion from C3-C7.  There is limited visualization of the plate inferior to the C6-C7 disc space.  Anterior plate and intervertebral bone plugs appear well positioned.  Surgical sponges are present anteriorly in the surgical bed.  IMPRESSION: Intraoperative views during C3-C7 ACDF.  No demonstrated complication.  Original Report Authenticated By: Gerrianne Scale, M.D.    Assessment/Plan: Dysphagia, drain 50cc last shift.  Will give decadron, continue drain, observe    LOS: 1 day    Betsi Crespi D,  MD 02/20/2011, 7:19 AM

## 2011-02-21 MED ORDER — MUPIROCIN 2 % EX OINT
1.0000 "application " | TOPICAL_OINTMENT | Freq: Two times a day (BID) | CUTANEOUS | Status: DC
  Filled 2011-02-21: qty 22

## 2011-02-21 MED ORDER — CHLORHEXIDINE GLUCONATE CLOTH 2 % EX PADS: 6.0000 | MEDICATED_PAD | Freq: Every day | CUTANEOUS | Status: DC

## 2011-02-21 NOTE — Progress Notes (Signed)
CARE MANAGEMENT NOTE 02/21/2011 Discharge planning. Patient had no Home Health needs.

## 2011-02-21 NOTE — Progress Notes (Signed)
Clinical Child psychotherapist received consult for "SNF." CSW reviewed chart, consulted with bedside RN, and RNCM. Pt is for discharge home today. CSW is signing as no further clinical social work needs identified.   Dede Query, MSW, Theresia Majors [phone]

## 2011-02-21 NOTE — Progress Notes (Signed)
Patient reports to RN that he completed 5 days of treatment at home for staph positive results.

## 2011-02-21 NOTE — Progress Notes (Signed)
Subjective: Patient reports "I feel good. I'm ready to go home."  Objective: Vital signs in last 24 hours: Temp:  [97.4 F (36.3 C)-98.4 F (36.9 C)] 98.3 F (36.8 C) (01/31 0700) Pulse Rate:  [66-101] 78  (01/31 0900) Resp:  [12-22] 19  (01/31 0900) BP: (119-146)/(66-114) 126/75 mmHg (01/31 0900) SpO2:  [94 %-100 %] 97 % (01/31 0900) Weight:  [97 kg (213 lb 13.5 oz)] 97 kg (213 lb 13.5 oz) (01/30 1115)  Intake/Output from previous day: 01/30 0701 - 01/31 0700 In: 2300 [P.O.:500; I.V.:1800] Out: 2555 [Urine:2475; Drains:80] Intake/Output this shift: Total I/O In: 150 [I.V.:150] Out: -   Alert, conversant. Strong voice. Drain with 10cc overnight. Incision with steri's, w/o erythema, swelling, tenderness, drainage. Good strength BUE. No dysphagia.  Lab Results: No results found for this basename: WBC:2,HGB:2,HCT:2,PLT:2 in the last 72 hours BMET No results found for this basename: NA:2,K:2,CL:2,CO2:2,GLUCOSE:2,BUN:2,CREATININE:2,CALCIUM:2 in the last 72 hours  Studies/Results: Dg Cervical Spine 2-3 Views  02/19/2011  *RADIOLOGY REPORT*  Clinical Data: C3-C7 ACDF.  CERVICAL SPINE - 2-3 VIEW  Comparison: Cervical MRI 01/07/2011 and radiographs 01/04/2011.  Findings: Two cross-table lateral views of the cervical spine are submitted postoperatively from the operating room.  Image #1 at 0810 hours demonstrates anterior needle localization of the C4 vertebral body and C5-C6 disc space.  Image #2 at 1100 hours demonstrates interval anterior discectomy and fusion from C3-C7.  There is limited visualization of the plate inferior to the C6-C7 disc space.  Anterior plate and intervertebral bone plugs appear well positioned.  Surgical sponges are present anteriorly in the surgical bed.  IMPRESSION: Intraoperative views during C3-C7 ACDF.  No demonstrated complication.  Original Report Authenticated By: Gerrianne Scale, M.D.   Cerv Spine Port(clearing)  02/20/2011  *RADIOLOGY REPORT*  Clinical  Data: Change in respiratory status.  PORTABLE CERVICAL SPINE (CLEARING)  Comparison: Radiographs dated 02/19/2011 and 01/04/2011  Findings: The patient has undergone anterior cervical fusion from C3-C7.  Hardware and interbody fusion devices appear in excellent position. There is minimal prominence of the prevertebral soft tissues at C3-4 the expected degree after surgery.  Airway is slightly narrowed in the AP dimension at that level.  Epiglottis is normal.  Proximal trachea is normal.  IMPRESSION: No significant prevertebral soft tissue swelling.  Postsurgical changes as expected.  Original Report Authenticated By: Gwynn Burly, M.D.    Assessment/Plan: Improved.  LOS: 2 days  Drain pulled, dsd to site & Decadron d/c'ed per Dr. Fredrich Birks order.  D/c instructions reviewed with pt who verballized undrstanding. Dr. Venetia Maxon will visit.  Hopeful of d/c home today.   Georgiann Cocker 02/21/2011, 9:24 AM

## 2011-02-21 NOTE — Evaluation (Signed)
Occupational Therapy Evaluation Patient Details Name: Chris Odom MRN: [id number] DOB: 09/01/1955 Today's Date: 02/21/2011  Problem List: There is no problem list on file for this patient.   Past Medical History:  Past Medical History  Diagnosis Date  . Chronic kidney disease     hx stones  . GERD (gastroesophageal reflux disease)   . COPD (chronic obstructive pulmonary disease)     a little -due to smoking- no problems recently  . Angina    Past Surgical History:  Past Surgical History  Procedure Date  . Foot neuroma surgery 10    rt  . Hand surgery 98    rt hand gangion, wrist ganglion  . Knee arthroscopy 05    ganglion cyst  . Kidney stone surgery     cysto  . Cervical fusion     C 3 -C7    OT Assessment/Plan/Recommendation OT Assessment Clinical Impression Statement: 56 yo male s/p ACDF without further acute OT needs. Pt education completed and pt with no questions. OT Recommendation/Assessment: Patient does not need any further OT services OT Recommendation Follow Up Recommendations: No OT follow up Equipment Recommended: None recommended by OT OT Goals    OT Evaluation Precautions/Restrictions  Precautions Precautions: Other (comment) Precaution Comments: Cervical - educated in post-op care (inlcuding posture, body mechanics and precautions) and supplied with handout Required Braces or Orthoses: Yes Cervical Brace: Hard collar Restrictions Weight Bearing Restrictions: No (limit UE weight bearing with cervical precautions) Prior Functioning Home Living Lives With: Alone;Other (Comment) Receives Help From: Family;Friend(s) Type of Home: House Home Layout: Two level Alternate Level Stairs-Rails: Left Alternate Level Stairs-Number of Steps: flight Home Access: Stairs to enter Entrance Stairs-Rails: Right Entrance Stairs-Number of Steps: 10 Bathroom Shower/Tub: Walk-in Contractor: Standard Bathroom Accessibility: Yes Home  Adaptive Equipment: None Prior Function Level of Independence: Independent with basic ADLs;Independent with homemaking with ambulation;Independent with gait;Independent with transfers Able to Take Stairs?: Reciprically Driving: Yes Vocation: Part time employment Vocation Requirements: works part-time at [employer]: Hobbies-yes (Comment) Comments: golf ADL ADL Eating/Feeding: Performed;Modified independent Where Assessed - Eating/Feeding: Chair Grooming: Simulated;Wash/dry hands Where Assessed - Grooming: Sitting, chair;Supported Location manager Dressing: Performed;Modified independent Where Assessed - Upper Body Dressing: Sitting, chair;Supported (Risk manager ) Lower Body Dressing: Performed;Modified independent Where Assessed - Lower Body Dressing: Sitting, chair;Supported (able to cross bil LE and touch toes) Ambulation Related to ADLs: pt observed this AM completing stair transfer with PT during session. Pt with out deficits ADL Comments: Pt educated on aspen collar care and don/doff brace. Pt without extra pads. RN AMY ordering new pads from ortho tech for pt to have spare pair prior to d/c home. Pt with hand out present in room and reviewed in relation to ADL care. Pt without questions at this time. Vision/Perception  Vision - History Baseline Vision: No visual deficits Cognition Cognition Arousal/Alertness: Awake/alert Overall Cognitive Status: Appears within functional limits for tasks assessed Orientation Level: Oriented X4 Sensation/Coordination Sensation Light Touch: Appears Intact Coordination Gross Motor Movements are Fluid and Coordinated: Yes Fine Motor Movements are Fluid and Coordinated: Yes Extremity Assessment RUE Assessment RUE Assessment: Within Functional Limits (grossly) LUE Assessment LUE Assessment:  (grossly) Mobility  Bed Mobility Bed Mobility: No Exercises   End of Session OT - End of Session Activity Tolerance: Patient tolerated treatment  well Patient left: in chair;with call bell in reach Nurse Communication: Mobility status for transfers General Behavior During Session: [facility] for tasks performed Cognition: [facility] for tasks performed   [place],  Kelle Darting 02/21/2011, 1:44 PM  Pager: [phone]

## 2011-02-21 NOTE — Discharge Summary (Signed)
Physician Discharge Summary  Patient ID: Chris Odom MRN: [id number] DOB/AGE: [birth year] 56 y.o.  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 *    Discharged Condition: good  Hospital Course: ACDF C3/4, C4/5, C5/6, C6/7.  Patient did well with surgery, postoperatively had some dysphagia for which he was monitored in ICU.  This rapidly resolved.  Consults: None  Significant Diagnostic Studies: none  Treatments: surgery: ACDF C3/4, C4/5, C5/6, C6/7  Discharge Exam: Blood pressure 122/93, pulse 73, temperature 98.3 F (36.8 C), temperature source Oral, resp. rate 16, height 6' (1.829 m), weight 97 kg (213 lb 13.5 oz), SpO2 97.00%. Neurologic: Alert and oriented X 3, normal strength and tone. Normal symmetric reflexes. Normal coordination and gait Incision/Wound:CDI  Disposition: Final discharge disposition not confirmed   Medication List  As of 02/21/2011 12:16 PM   ASK your doctor about these medications         famotidine 20 MG tablet   Commonly known as: PEPCID   Take 20 mg by mouth once.             Signed: Mikea Quadros D 02/21/2011, 12:16 PM

## 2011-02-21 NOTE — Progress Notes (Signed)
Physical Therapy Treatment and Discharge Note Patient Details Name: Chris Odom MRN: [id number] DOB: 01/03/1956 Today's Date: 02/21/2011  PT Assessment/Plan  PT - Assessment/Plan Comments on Treatment Session: Patient has achieved all transfer and ambulation goals for PT and will be discharge from service - No post acute needs Follow Up Recommendations: No PT follow up Equipment Recommended: None recommended by PT PT Goals  Acute Rehab PT Goals PT Goal: Sit to Stand - Progress: Met PT Goal: Stand to Sit - Progress: Met PT Goal: Ambulate - Progress: Met PT Goal: Up/Down Stairs - Progress: Met Additional Goals PT Goal: Additional Goal #1 - Progress: Met  PT Treatment Precautions/Restrictions  Precautions Precautions: Other (comment) (cervical) Precaution Comments: Cervical - educated in post-op care (inlcuding posture, body mechanics and precautions) and supplied with handout Required Braces or Orthoses: Yes Cervical Brace: Hard collar Restrictions Weight Bearing Restrictions: No Mobility (including Balance) Transfers Sit to Stand: 7: Independent;From chair/3-in-1 Stand to Sit: 7: Independent;To chair/3-in-1 Ambulation/Gait Ambulation/Gait Assistance: 7: Independent Ambulation Distance (Feet): 300 Feet Assistive device: None Gait Pattern: Within Functional Limits Stairs Assistance: 7: Independent Stair Management Technique: One rail Right;Alternating pattern;Forwards Number of Stairs: 10  Height of Stairs: 6   Posture/Postural Control Posture/Postural Control: No significant limitations Dynamic Standing Balance Dynamic Standing - Balance Support: No upper extremity supported Dynamic Standing - Level of Assistance: 7: Independent End of Session PT - End of Session Equipment Utilized During Treatment: Gait belt;Cervical collar Activity Tolerance: Patient tolerated treatment well Patient left: in chair;with call bell in reach Nurse Communication: Mobility status for  ambulation General Behavior During Session: [facility] for tasks performed Cognition: [facility] for tasks performed  Edwyna Perfect, PT  Pager [phone] 02/21/2011, 8:45 AM

## 2012-01-22 HISTORY — PX: EXCISIONAL HEMORRHOIDECTOMY: SHX1541

## 2012-03-03 ENCOUNTER — Ambulatory Visit: Payer: Self-pay | Admitting: Family Medicine

## 2012-05-05 ENCOUNTER — Encounter: Payer: Self-pay | Admitting: General Surgery

## 2012-05-05 ENCOUNTER — Ambulatory Visit (INDEPENDENT_AMBULATORY_CARE_PROVIDER_SITE_OTHER): Payer: BC Managed Care – PPO | Admitting: General Surgery

## 2012-05-05 VITALS — BP 118/78 | Ht 71.0 in | Wt 201.0 lb

## 2012-05-05 DIAGNOSIS — K625 Hemorrhage of anus and rectum: Secondary | ICD-10-CM

## 2012-05-05 DIAGNOSIS — K648 Other hemorrhoids: Secondary | ICD-10-CM

## 2012-05-05 LAB — HEMOCCULT GUIAC POC 1CARD (OFFICE)

## 2012-05-05 NOTE — Progress Notes (Addendum)
Patient ID: Chris Odom, male   DOB: Oct 08, 1955, 57 y.o.   MRN: [id number]  Chief Complaint  Patient presents with  . Hemorrhoids    HPI Chris Odom is a 57 y.o. male who presents for evaluation of hemorrhoids. State he has regular BM's at least 3 times a day.  Complaints of bleeding with BM, no pain occasional pressure.  Has been going on for about 1 year. Treating the hemorrhoids for about 4 months, tried RX foam and steroid suppositories which has not  helped.  HPI  Past Medical History  Diagnosis Date  . Chronic kidney disease     hx stones  . GERD (gastroesophageal reflux disease)   . COPD (chronic obstructive pulmonary disease)     a little -due to smoking- no problems recently  . Angina     Past Surgical History  Procedure Laterality Date  . Foot neuroma surgery  10    rt  . Hand surgery  98    rt hand gangion, wrist ganglion  . Knee arthroscopy  05    ganglion cyst  . Kidney stone surgery      cysto  . Cervical fusion      C 3 -C7  . Colonoscopy  2009    internal hemorrhoids    Family History  Problem Relation Age of Onset  . Hypertension Mother   . Diabetes Father     Social History History  Substance Use Topics  . Smoking status: Former Smoker -- 2.50 packs/day for 30 years    Quit date: 06/19/2005  . Smokeless tobacco: Never Used  . Alcohol Use: No    No Known Allergies  No current outpatient prescriptions on file.   No current facility-administered medications for this visit.    Review of Systems Review of Systems  Constitutional: Negative.   Respiratory: Negative.   Cardiovascular: Negative.     Blood pressure 118/78, height 5\' 11"  (1.803 m), weight 201 lb (91.173 kg).  Physical Exam Physical Exam  Constitutional: He is oriented to person, place, and time. He appears well-developed and well-nourished.  Cardiovascular: Normal rate and regular rhythm.   Pulmonary/Chest: Effort normal and breath sounds normal.  Neurological: He is  alert and oriented to person, place, and time.  Skin: Skin is warm and dry.   Right Anterior lateral hemorrhoid Occult poositive Data Reviewed Laboratory studies submitted by his primary physician, Chris Odom, M.D. dated 04/16/2011 include a normal ECG was sinus bradycardia, normal urinalysis, CBC with a white blood cell count of 7700 and hemoglobin of 15.9. Normal comprehensive metabolic panel. No evidence of H. pylori infection. Normal liver enzymes. Colonoscopy dated 06/30/2008 completed by Chris Odom, M.D. showed internal hemorrhoids. No other pathology.   Anoscopy showed prominent mucus in the rectal vault. There was a fleshy area on the anterior aspect of the rectum consistent with uterine inflamed internal hemorrhoid or a low-lying polyp. The area was fragile, with spontaneous bleeding with light touch.  Assessment    Symptomatic internal hemorrhoids/low rectal polyp.    Plan    The patient would be best managed by surgical excision as he has failed conservative measures.       Chris Odom 05/06/2012, 7:42 AM

## 2012-05-05 NOTE — Patient Instructions (Addendum)
Hemorrhoidectomy surgery Avoid constipation Avoid heavy lifting for 1 week  Patient's surgery has been scheduled for 05-14-12 at [facility].

## 2012-05-06 ENCOUNTER — Encounter: Payer: Self-pay | Admitting: General Surgery

## 2012-05-06 ENCOUNTER — Other Ambulatory Visit: Payer: Self-pay | Admitting: General Surgery

## 2012-05-06 DIAGNOSIS — K648 Other hemorrhoids: Secondary | ICD-10-CM

## 2012-05-14 ENCOUNTER — Ambulatory Visit: Payer: Self-pay | Admitting: General Surgery

## 2012-05-14 DIAGNOSIS — K648 Other hemorrhoids: Secondary | ICD-10-CM

## 2012-05-20 ENCOUNTER — Telehealth: Payer: Self-pay | Admitting: *Deleted

## 2012-05-20 ENCOUNTER — Encounter: Payer: Self-pay | Admitting: General Surgery

## 2012-05-20 NOTE — Telephone Encounter (Signed)
Notified patient as instructed, patient pleased. Discussed follow-up appointments, patient agrees  

## 2012-05-21 ENCOUNTER — Ambulatory Visit (INDEPENDENT_AMBULATORY_CARE_PROVIDER_SITE_OTHER): Payer: BC Managed Care – PPO | Admitting: *Deleted

## 2012-05-21 DIAGNOSIS — K648 Other hemorrhoids: Secondary | ICD-10-CM

## 2012-05-21 NOTE — Progress Notes (Signed)
Patient came in today for a wound check post hemorrhoidectomy.  The wound is clean, with no signs of infection noted. Follow up as scheduled.

## 2012-05-21 NOTE — Patient Instructions (Addendum)
The patient is aware to call back for any questions or concerns. Follow up as scheduled May 15 Discussed avoiding constipation Using tylenol/ibuprofen for pain

## 2012-06-04 ENCOUNTER — Ambulatory Visit (INDEPENDENT_AMBULATORY_CARE_PROVIDER_SITE_OTHER): Payer: BC Managed Care – PPO | Admitting: General Surgery

## 2012-06-04 ENCOUNTER — Encounter: Payer: Self-pay | Admitting: General Surgery

## 2012-06-04 VITALS — BP 122/68 | HR 72 | Resp 16 | Ht 71.0 in | Wt 200.0 lb

## 2012-06-04 DIAGNOSIS — K648 Other hemorrhoids: Secondary | ICD-10-CM

## 2012-06-04 NOTE — Progress Notes (Signed)
Patient ID: Chris Odom, male   DOB: [birth year], 57 y.o.   MRN: [id number]   The patient returns today for a post op hemorrhoidectomy visit. The procedure was performed on 05/14/12. He states he is doing well. No complaints or bowel problems at this time.   The patient reports yet excellent pain relief from the use of the long-acting Marcaine, but after 24 hours he had significant pain. This is markedly improved in the last week. He is now having regular bowel movements without bleeding or discomfort.  External anal exam shows excellent healing. A small tag of Vicryl suture was excised without incident. There

## 2012-06-04 NOTE — Patient Instructions (Signed)
Patient to return as needed. 

## 2012-06-05 ENCOUNTER — Encounter: Payer: Self-pay | Admitting: General Surgery

## 2012-07-10 ENCOUNTER — Ambulatory Visit: Payer: Self-pay | Admitting: Family Medicine

## 2012-10-06 ENCOUNTER — Ambulatory Visit: Payer: Self-pay | Admitting: Family Medicine

## 2012-11-26 ENCOUNTER — Ambulatory Visit: Payer: Self-pay

## 2012-12-04 ENCOUNTER — Ambulatory Visit (HOSPITAL_BASED_OUTPATIENT_CLINIC_OR_DEPARTMENT_OTHER)
Admission: RE | Admit: 2012-12-04 | Discharge: 2012-12-04 | Disposition: A | Discharge status: Home / Self Care | Payer: BC Managed Care – PPO | Source: Ambulatory Visit | Attending: Orthopedic Surgery | Admitting: Orthopedic Surgery

## 2012-12-04 ENCOUNTER — Ambulatory Visit (HOSPITAL_BASED_OUTPATIENT_CLINIC_OR_DEPARTMENT_OTHER): Payer: BC Managed Care – PPO | Admitting: Anesthesiology

## 2012-12-04 ENCOUNTER — Encounter (HOSPITAL_BASED_OUTPATIENT_CLINIC_OR_DEPARTMENT_OTHER): Payer: BC Managed Care – PPO | Admitting: Anesthesiology

## 2012-12-04 ENCOUNTER — Encounter (HOSPITAL_BASED_OUTPATIENT_CLINIC_OR_DEPARTMENT_OTHER): Payer: Self-pay

## 2012-12-04 ENCOUNTER — Encounter (HOSPITAL_BASED_OUTPATIENT_CLINIC_OR_DEPARTMENT_OTHER)
Admission: RE | Disposition: A | Discharge status: Home / Self Care | Payer: Self-pay | Source: Ambulatory Visit | Attending: Orthopedic Surgery

## 2012-12-04 ENCOUNTER — Other Ambulatory Visit: Payer: Self-pay | Admitting: Orthopedic Surgery

## 2012-12-04 DIAGNOSIS — J4489 Other specified chronic obstructive pulmonary disease: Secondary | ICD-10-CM | POA: Insufficient documentation

## 2012-12-04 DIAGNOSIS — I209 Angina pectoris, unspecified: Secondary | ICD-10-CM | POA: Insufficient documentation

## 2012-12-04 DIAGNOSIS — Z87891 Personal history of nicotine dependence: Secondary | ICD-10-CM | POA: Insufficient documentation

## 2012-12-04 DIAGNOSIS — L02519 Cutaneous abscess of unspecified hand: Secondary | ICD-10-CM | POA: Insufficient documentation

## 2012-12-04 DIAGNOSIS — K219 Gastro-esophageal reflux disease without esophagitis: Secondary | ICD-10-CM | POA: Insufficient documentation

## 2012-12-04 DIAGNOSIS — N189 Chronic kidney disease, unspecified: Secondary | ICD-10-CM | POA: Insufficient documentation

## 2012-12-04 DIAGNOSIS — J449 Chronic obstructive pulmonary disease, unspecified: Secondary | ICD-10-CM | POA: Insufficient documentation

## 2012-12-04 DIAGNOSIS — L03019 Cellulitis of unspecified finger: Secondary | ICD-10-CM | POA: Insufficient documentation

## 2012-12-04 HISTORY — PX: INCISION AND DRAINAGE ABSCESS: SHX5864

## 2012-12-04 LAB — CBC
MCH: 30.8 pg (ref 26.0–34.0)
MCHC: 35.2 g/dL (ref 30.0–36.0)
Platelets: 196 10*3/uL (ref 150–400)
RDW: 12.6 % (ref 11.5–15.5)

## 2012-12-04 LAB — POCT HEMOGLOBIN-HEMACUE: Hemoglobin: 16.1 g/dL (ref 13.0–17.0)

## 2012-12-04 SURGERY — INCISION AND DRAINAGE, ABSCESS
Anesthesia: General | Site: Hand | Laterality: Left | Wound class: Dirty or Infected

## 2012-12-04 MED ORDER — VANCOMYCIN HCL IN DEXTROSE 500-5 MG/100ML-% IV SOLN
INTRAVENOUS | Status: AC
  Filled 2012-12-04: qty 100

## 2012-12-04 MED ORDER — LIDOCAINE HCL (CARDIAC) 20 MG/ML IV SOLN
INTRAVENOUS | Status: DC | PRN
  Administered 2012-12-04: 80 mg via INTRAVENOUS

## 2012-12-04 MED ORDER — MIDAZOLAM HCL 5 MG/5ML IJ SOLN
INTRAMUSCULAR | Status: DC | PRN
  Administered 2012-12-04: 2 mg via INTRAVENOUS

## 2012-12-04 MED ORDER — ONDANSETRON HCL 4 MG/2ML IJ SOLN
INTRAMUSCULAR | Status: DC | PRN
  Administered 2012-12-04: 4 mg via INTRAVENOUS

## 2012-12-04 MED ORDER — OXYCODONE HCL 5 MG/5ML PO SOLN: 5.0000 mg | Freq: Once | ORAL | Status: AC | PRN

## 2012-12-04 MED ORDER — OXYCODONE HCL 5 MG PO TABS
5.0000 mg | ORAL_TABLET | Freq: Once | ORAL | Status: AC | PRN
  Administered 2012-12-04: 5 mg via ORAL

## 2012-12-04 MED ORDER — LACTATED RINGERS IV SOLN
INTRAVENOUS | Status: DC
  Administered 2012-12-04 (×2): via INTRAVENOUS

## 2012-12-04 MED ORDER — CHLORHEXIDINE GLUCONATE 4 % EX LIQD: 60.0000 mL | Freq: Once | CUTANEOUS | Status: DC

## 2012-12-04 MED ORDER — OXYCODONE HCL 5 MG PO TABS
ORAL_TABLET | ORAL | Status: AC
  Filled 2012-12-04: qty 1

## 2012-12-04 MED ORDER — MIDAZOLAM HCL 2 MG/2ML IJ SOLN
INTRAMUSCULAR | Status: AC
  Filled 2012-12-04: qty 2

## 2012-12-04 MED ORDER — BUPIVACAINE HCL (PF) 0.25 % IJ SOLN
INTRAMUSCULAR | Status: DC | PRN
  Administered 2012-12-04: 10 mL

## 2012-12-04 MED ORDER — FENTANYL CITRATE 0.05 MG/ML IJ SOLN
25.0000 ug | INTRAMUSCULAR | Status: DC | PRN
  Administered 2012-12-04: 50 ug via INTRAVENOUS

## 2012-12-04 MED ORDER — FENTANYL CITRATE 0.05 MG/ML IJ SOLN
50.0000 ug | Freq: Once | INTRAMUSCULAR | Status: AC
  Administered 2012-12-04: 50 ug via INTRAVENOUS

## 2012-12-04 MED ORDER — VANCOMYCIN HCL IN DEXTROSE 1-5 GM/200ML-% IV SOLN
INTRAVENOUS | Status: AC
  Filled 2012-12-04: qty 200

## 2012-12-04 MED ORDER — FENTANYL CITRATE 0.05 MG/ML IJ SOLN
INTRAMUSCULAR | Status: AC
  Filled 2012-12-04: qty 2

## 2012-12-04 MED ORDER — OXYCODONE-ACETAMINOPHEN 5-325 MG PO TABS: ORAL_TABLET | ORAL | Status: DC

## 2012-12-04 MED ORDER — FENTANYL CITRATE 0.05 MG/ML IJ SOLN
INTRAMUSCULAR | Status: DC | PRN
  Administered 2012-12-04 (×2): 25 ug via INTRAVENOUS
  Administered 2012-12-04: 100 ug via INTRAVENOUS

## 2012-12-04 MED ORDER — FENTANYL CITRATE 0.05 MG/ML IJ SOLN
INTRAMUSCULAR | Status: AC
  Filled 2012-12-04: qty 4

## 2012-12-04 MED ORDER — AMOXICILLIN-POT CLAVULANATE 875-125 MG PO TABS: 1.0000 | ORAL_TABLET | Freq: Two times a day (BID) | ORAL | Status: DC

## 2012-12-04 MED ORDER — VANCOMYCIN HCL 1000 MG IV SOLR
1000.0000 mg | INTRAVENOUS | Status: DC | PRN
  Administered 2012-12-04: 1500 mg via INTRAVENOUS

## 2012-12-04 MED ORDER — CEFAZOLIN SODIUM 1-5 GM-% IV SOLN
INTRAVENOUS | Status: AC
  Filled 2012-12-04: qty 100

## 2012-12-04 MED ORDER — DEXAMETHASONE SODIUM PHOSPHATE 4 MG/ML IJ SOLN
INTRAMUSCULAR | Status: DC | PRN
  Administered 2012-12-04: 10 mg via INTRAVENOUS

## 2012-12-04 MED ORDER — PROPOFOL 10 MG/ML IV BOLUS
INTRAVENOUS | Status: DC | PRN
  Administered 2012-12-04: 250 mg via INTRAVENOUS

## 2012-12-04 MED ORDER — MIDAZOLAM HCL 2 MG/2ML IJ SOLN: 1.0000 mg | INTRAMUSCULAR | Status: DC | PRN

## 2012-12-04 MED ORDER — ONDANSETRON HCL 4 MG/2ML IJ SOLN: 4.0000 mg | Freq: Four times a day (QID) | INTRAMUSCULAR | Status: DC | PRN

## 2012-12-04 MED ORDER — BUPIVACAINE HCL (PF) 0.25 % IJ SOLN
INTRAMUSCULAR | Status: AC
  Filled 2012-12-04: qty 30

## 2012-12-04 SURGICAL SUPPLY — 47 items
BAG DECANTER FOR FLEXI CONT (MISCELLANEOUS) IMPLANT
BANDAGE ELASTIC 3 VELCRO ST LF (GAUZE/BANDAGES/DRESSINGS) IMPLANT
BANDAGE GAUZE ELAST BULKY 4 IN (GAUZE/BANDAGES/DRESSINGS) ×2 IMPLANT
BANDAGE GAUZE STRT 1 STR LF (GAUZE/BANDAGES/DRESSINGS) IMPLANT
BLADE MINI RND TIP GREEN BEAV (BLADE) IMPLANT
BLADE SURG 15 STRL LF DISP TIS (BLADE) ×3 IMPLANT
BLADE SURG 15 STRL SS (BLADE) ×3
BNDG COHESIVE 1X5 TAN STRL LF (GAUZE/BANDAGES/DRESSINGS) IMPLANT
BNDG ELASTIC 2 VLCR STRL LF (GAUZE/BANDAGES/DRESSINGS) IMPLANT
BNDG ESMARK 4X9 LF (GAUZE/BANDAGES/DRESSINGS) ×4 IMPLANT
CHLORAPREP W/TINT 26ML (MISCELLANEOUS) IMPLANT
CORDS BIPOLAR (ELECTRODE) ×2 IMPLANT
COVER MAYO STAND STRL (DRAPES) ×4 IMPLANT
COVER TABLE BACK 60X90 (DRAPES) ×4 IMPLANT
CUFF TOURNIQUET SINGLE 18IN (TOURNIQUET CUFF) ×2 IMPLANT
DRAPE EXTREMITY T 121X128X90 (DRAPE) ×2 IMPLANT
DRAPE SURG 17X23 STRL (DRAPES) ×2 IMPLANT
GAUZE PACKING IODOFORM 1/4X5 (PACKING) ×2 IMPLANT
GAUZE XEROFORM 1X8 LF (GAUZE/BANDAGES/DRESSINGS) ×2 IMPLANT
GLOVE BIO SURGEON STRL SZ 6.5 (GLOVE) ×4 IMPLANT
GLOVE BIO SURGEON STRL SZ7.5 (GLOVE) ×4 IMPLANT
GLOVE BIOGEL PI IND STRL 7.0 (GLOVE) ×2 IMPLANT
GLOVE BIOGEL PI IND STRL 8 (GLOVE) ×2 IMPLANT
GLOVE BIOGEL PI INDICATOR 7.0 (GLOVE) ×2
GLOVE BIOGEL PI INDICATOR 8 (GLOVE) ×2
GOWN BRE IMP PREV XXLGXLNG (GOWN DISPOSABLE) ×6 IMPLANT
GOWN PREVENTION PLUS XLARGE (GOWN DISPOSABLE) ×4 IMPLANT
LOOP VESSEL MAXI BLUE (MISCELLANEOUS) IMPLANT
NEEDLE HYPO 25X1 1.5 SAFETY (NEEDLE) IMPLANT
NS IRRIG 1000ML POUR BTL (IV SOLUTION) ×4 IMPLANT
PACK BASIN DAY SURGERY FS (CUSTOM PROCEDURE TRAY) ×4 IMPLANT
PAD CAST 3X4 CTTN HI CHSV (CAST SUPPLIES) IMPLANT
PADDING CAST ABS 4INX4YD NS (CAST SUPPLIES) ×1
PADDING CAST ABS COTTON 4X4 ST (CAST SUPPLIES) ×1 IMPLANT
PADDING CAST COTTON 3X4 STRL (CAST SUPPLIES)
SPLINT PLASTER CAST XFAST 3X15 (CAST SUPPLIES) IMPLANT
SPLINT PLASTER XTRA FASTSET 3X (CAST SUPPLIES)
SPONGE GAUZE 4X4 12PLY (GAUZE/BANDAGES/DRESSINGS) ×2 IMPLANT
STOCKINETTE 4X48 STRL (DRAPES) ×2 IMPLANT
SUT ETHILON 4 0 PS 2 18 (SUTURE) IMPLANT
SWAB COLLECTION DEVICE MRSA (MISCELLANEOUS) ×2 IMPLANT
SYR BULB 3OZ (MISCELLANEOUS) ×4 IMPLANT
SYR CONTROL 10ML LL (SYRINGE) ×4 IMPLANT
TOWEL OR 17X24 6PK STRL BLUE (TOWEL DISPOSABLE) ×4 IMPLANT
TUBE ANAEROBIC SPECIMEN COL (MISCELLANEOUS) IMPLANT
TUBE FEEDING 5FR 15 INCH (TUBING) IMPLANT
UNDERPAD 30X30 INCONTINENT (UNDERPADS AND DIAPERS) ×2 IMPLANT

## 2012-12-04 NOTE — Op Note (Signed)
[id number] 

## 2012-12-04 NOTE — Brief Op Note (Signed)
12/04/2012  4:23 PM  PATIENT:  Chris Odom  57 y.o. male  PRE-OPERATIVE DIAGNOSIS:  left thumb infection  POST-OPERATIVE DIAGNOSIS:  left thumb infection  PROCEDURE:  Procedure(s) with comments: INCISION AND DRAINAGE ABSCESS (Left) - i and d np joint abcess  left thumb   SURGEON:  Surgeon(s) and Role:    * Tami Ribas, MD - Primary    * Nicki Reaper, MD  PHYSICIAN ASSISTANT:   ASSISTANTS: Cindee Salt, MD   ANESTHESIA:   general  EBL:  Total I/O In: 1300 [I.V.:1300] Out: -   BLOOD ADMINISTERED:none  DRAINS: vessel loops in mp joint, iodoform packing  LOCAL MEDICATIONS USED:  MARCAINE     SPECIMEN:  Source of Specimen:  left thumb  DISPOSITION OF SPECIMEN:  micro  COUNTS:  YES  TOURNIQUET:   Total Tourniquet Time Documented: Upper Arm (Left) - 19 minutes Total: Upper Arm (Left) - 19 minutes   DICTATION: .Other Dictation: Dictation Number [id number]  PLAN OF CARE: Discharge to home after PACU  PATIENT DISPOSITION:  PACU - hemodynamically stable.

## 2012-12-04 NOTE — Anesthesia Procedure Notes (Signed)
Procedure Name: LMA Insertion Performed by: Graham Hyun, [place] Pre-anesthesia Checklist: Patient identified, Emergency Drugs available, Suction available and Patient being monitored Patient Re-evaluated:Patient Re-evaluated prior to inductionOxygen Delivery Method: Circle System Utilized Preoxygenation: Pre-oxygenation with 100% oxygen Intubation Type: IV induction Ventilation: Mask ventilation without difficulty LMA: LMA inserted LMA Size: 5.0 Number of attempts: 1 Airway Equipment and Method: bite block Placement Confirmation: positive ETCO2 Tube secured with: Tape Dental Injury: Teeth and Oropharynx as per pre-operative assessment      

## 2012-12-04 NOTE — Anesthesia Postprocedure Evaluation (Signed)
Anesthesia Post Note  Patient: Chris Odom  Procedure(s) Performed: Procedure(s) (LRB): INCISION AND DRAINAGE ABSCESS (Left)  Anesthesia type: General  Patient location: PACU  Post pain: Pain level controlled and Adequate analgesia  Post assessment: Post-op Vital signs reviewed, Patient's Cardiovascular Status Stable, Respiratory Function Stable, Patent Airway and Pain level controlled  Last Vitals:  Filed Vitals:   12/04/12 1700  BP: 148/81  Pulse: 79  Temp:   Resp: 17    Post vital signs: Reviewed and stable  Level of consciousness: awake, alert  and oriented  Complications: No apparent anesthesia complications

## 2012-12-04 NOTE — H&P (Signed)
Chris Odom is an 57 y.o. male.   Chief Complaint: left thumb infection HPI: 57 yo rhd male states he was skinning a beaver ~ 1 week ago, he suffered a laceration to the left thumb.  Two days ago began to have swelling, pain, and erythema.  Has had subjective fevers/chills.  Seen at UC two days ago and given rocephin.  Seen by PCP yesterday who started him on augmentin.  Has had no improvement and was referred for further care.  Past Medical History  Diagnosis Date  . Chronic kidney disease     hx stones  . GERD (gastroesophageal reflux disease)   . COPD (chronic obstructive pulmonary disease)     a little -due to smoking- no problems recently  . Angina     Past Surgical History  Procedure Laterality Date  . Foot neuroma surgery  10    rt  . Hand surgery  98    rt hand gangion, wrist ganglion  . Knee arthroscopy  05    ganglion cyst  . Kidney stone surgery      cysto  . Cervical fusion      C 3 -C7  . Colonoscopy  2009    internal hemorrhoids  . Excisional hemorrhoidectomy  2014    Family History  Problem Relation Age of Onset  . Hypertension Mother   . Diabetes Father    Social History:  reports that he quit smoking about 7 years ago. He has never used smokeless tobacco. He reports that he does not drink alcohol or use illicit drugs.  Allergies: No Known Allergies  Medications Prior to Admission  Medication Sig Dispense Refill  . Docusate Sodium (COLACE PO) Take 1 tablet by mouth daily.        Results for orders placed during the hospital encounter of 12/04/12 (from the past 48 hour(s))  CBC     Status: None   Collection Time    12/04/12  1:00 PM      Result Value Range   WBC 6.4  4.0 - 10.5 K/uL   RBC 5.06  4.22 - 5.81 MIL/uL   Hemoglobin 15.6  13.0 - 17.0 g/dL   HCT 16.1  09.6 - 04.5 %   MCV 87.5  78.0 - 100.0 fL   MCH 30.8  26.0 - 34.0 pg   MCHC 35.2  30.0 - 36.0 g/dL   RDW 40.9  81.1 - 91.4 %   Platelets 196  150 - 400 K/uL    No results  found.   A comprehensive review of systems was negative except for: Constitutional: positive for fevers Eyes: positive for contacts/glasses  Blood pressure 149/87, pulse 76, temperature 98.2 F (36.8 C), temperature source Oral, resp. rate 20, height 6' (1.829 m), weight 200 lb (90.719 kg), SpO2 99.00%.  General appearance: alert, cooperative and appears stated age Head: Normocephalic, without obvious abnormality, atraumatic Neck: supple, symmetrical, trachea midline Resp: clear to auscultation bilaterally Cardio: regular rate and rhythm GI: non tender Extremities: intact sensation and capillary refill all digits.  +epl/fpl/io.  left thumb with laceration at radial side of mp joint with surrounding erythema and erythema onto dorsum of hand.  pain in thumb and wrist.  no lymphadenopath or lymphangitis.   Pulses: 2+ and symmetric Skin: Skin color, texture, turgor normal. No rashes or lesions Neurologic: Grossly normal Incision/Wound: As above  Assessment/Plan Left thumb laceration with infection of mp joint.  Non operative and operative treatment options were discussed with the patient and  patient wishes to proceed with operative treatment. Recommend OR for I&D of left thumb including mp joint.  Risks, benefits, and alternatives of surgery were discussed and the patient agrees with the plan of care.   Jiraiya Mcewan R 12/04/2012, 1:42 PM

## 2012-12-04 NOTE — Transfer of Care (Signed)
Immediate Anesthesia Transfer of Care Note  Patient: Chris Odom  Procedure(s) Performed: Procedure(s) with comments: INCISION AND DRAINAGE ABSCESS (Left) - i and d np joint abcess  left thumb   Patient Location: PACU  Anesthesia Type:General  Level of Consciousness: sedated and confused  Airway & Oxygen Therapy: Patient Spontanous Breathing and Patient connected to face mask oxygen  Post-op Assessment: Report given to PACU RN and Post -op Vital signs reviewed and stable  Post vital signs: Reviewed and stable  Complications: No apparent anesthesia complications

## 2012-12-04 NOTE — Anesthesia Preprocedure Evaluation (Signed)
Anesthesia Evaluation  Patient identified by MRN, date of birth, ID band Patient awake    Reviewed: Allergy & Precautions, H&P , NPO status , Patient's Chart, lab work & pertinent test results  Airway Mallampati: II  Neck ROM: full    Dental   Pulmonary COPDformer smoker,          Cardiovascular     Neuro/Psych    GI/Hepatic GERD-  ,  Endo/Other    Renal/GU      Musculoskeletal   Abdominal   Peds  Hematology   Anesthesia Other Findings   Reproductive/Obstetrics                           Anesthesia Physical Anesthesia Plan  ASA: II  Anesthesia Plan: General   Post-op Pain Management:    Induction: Intravenous  Airway Management Planned: LMA  Additional Equipment:   Intra-op Plan:   Post-operative Plan:   Informed Consent: I have reviewed the patients History and Physical, chart, labs and discussed the procedure including the risks, benefits and alternatives for the proposed anesthesia with the patient or authorized representative who has indicated his/her understanding and acceptance.     Plan Discussed with: Anesthesiologist, Surgeon and CRNA  Anesthesia Plan Comments:         Anesthesia Quick Evaluation

## 2012-12-05 NOTE — Op Note (Signed)
NAMESHUAIB, CORSINO NO.:  [id number]  MEDICAL RECORD NO.:  [id number]  LOCATION:                                 FACILITY:  PHYSICIAN:  Betha Loa, MD             DATE OF BIRTH:  DATE OF PROCEDURE:  12/04/2012 DATE OF DISCHARGE:                              OPERATIVE REPORT   PREOPERATIVE DIAGNOSIS:  Left thumb MP joint infection.  POSTOPERATIVE DIAGNOSIS:  Left thumb abscess.  SURGEON:  Betha Loa, MD  ASSISTANT:  Cindee Salt, MD  ANESTHESIA:  General.  IV FLUIDS:  Per anesthesia flow sheet.  ESTIMATED BLOOD LOSS:  Minimal.  COMPLICATIONS:  None.  SPECIMENS:  Aerobic and anaerobic cultures from left thumb to micro.  TOURNIQUET TIME:  19 minutes.  DISPOSITION:  Stable to PACU.  INDICATIONS:  Mr. Spahr is a 57 year old right-hand dominant male who states approximately 1 week ago he lacerated his left thumb while skining in [place].  Two days ago, he began to have pain and swelling and was seen in urgent care facility and given a shot of Rocephin.  He presented to his primary care physician the following day and was started on Augmentin.  He has had continued worsening of the swelling, pain and erythema.  He was referred to [place] this morning for care.  I recommended to him going to the operating room for irrigation and debridement of the thumbs and MP joint.  Risks, benefits and alternatives of surgery were discussed including the risk of blood loss, infection, damage to nerves, vessels, tendons, ligaments, bone; failure of surgery, need for additional surgery, complications with wound healing, continued pain, continued infection, need for repeat irrigation and debridement.  He voiced understanding of these risks and elected to proceed.  OPERATIVE COURSE:  After being identified preoperatively by myself, the patient and I agreed upon procedure and site of procedure.  Surgical site was marked.  The risks, benefits, and alternatives of surgery  were reviewed and he wished to proceed.  Surgical consent had been signed. He was transported to the operating room and placed on the operating room table in supine position with the left upper extremity on arm board.  General anesthesia was induced by anesthesiologist.  Left upper extremity was prepped and draped in normal sterile orthopedic fashion. A surgical pause was performed between surgeons, anesthesia, operating room staff, and all were in agreement as to the patient, procedure, and site of procedure.  Tourniquet at the proximal aspect of the extremity was inflated to 250 mmHg after gravity exsanguination of the hand and Esmarch exsanguination was performed.  The wound was extended both proximally and distally.  It was carried into subcutaneous tissues by spreading technique.  Gross purulence was encountered in the subcutaneous tissues.  Cultures for aerobes and anaerobes were taken. The purulence was tracked surrounding the adductor pollicis brevis muscle and tendon.  The abscess cavity appeared to course volarly onto the thenar eminence.  This was opened.  The hand and thumb were milked to see if there was any additional purulence which there was none.  The MP joint was entered.  There was no gross purulence within  the joint. The subcutaneous tissues were debrided sharply using the scissors.  The MP joint was copiously irrigated with greater than 100 mL of sterile saline by Angiocath needle.  The abscess cavity was copiously irrigated with sterile saline by bulb syringe.  The abscess cavity was packed with quarter-inch iodoform gauze and a vessel loop drains placed in the MP joint.  A small piece of iodoform was placed in a wick into the MP joint as well.  The wound was injected with 10 mL of 0.25% plain Marcaine to aid in postoperative analgesia.  It was then dressed with sterile Xeroform while still allowing space for active drainage.  It was then dressed with 4 x 4s and  wrapped with a Kerlix bandage lightly.  A thumb spica splint was placed and wrapped with Kerlix and Ace bandage. Tourniquet was deflated at 19 minutes.  Fingertips were pink with brisk capillary refill after deflation of tourniquet.  The operative drapes were broken down.  The patient was awoken from anesthesia safely.  He has transferred back to stretcher and taken to PACU in stable condition. I will see him back in the office on Monday for postoperative followup and initiation of hydrotherapy with packing changes.  I will give him Percocet 5/325, 1-2 p.o. q.6 hours p.r.n. pain, dispensed #40, and he will continue on Augmentin 875 mg p.o. b.i.d.  This antibiotic choice was discussed with infectious disease doctor.  He was given a dose of IV vancomycin after cultures had been taken in the operating room.     Betha Loa, MD     KK/MEDQ  D:  12/04/2012  T:  12/05/2012  Job:  [id number]

## 2012-12-07 ENCOUNTER — Encounter (HOSPITAL_BASED_OUTPATIENT_CLINIC_OR_DEPARTMENT_OTHER): Payer: Self-pay | Admitting: Orthopedic Surgery

## 2012-12-08 LAB — CULTURE, ROUTINE-ABSCESS: Culture: NO GROWTH

## 2012-12-09 LAB — ANAEROBIC CULTURE

## 2012-12-21 ENCOUNTER — Other Ambulatory Visit: Payer: Self-pay | Admitting: Orthopedic Surgery

## 2012-12-22 ENCOUNTER — Ambulatory Visit (HOSPITAL_BASED_OUTPATIENT_CLINIC_OR_DEPARTMENT_OTHER): Payer: BC Managed Care – PPO | Admitting: Anesthesiology

## 2012-12-22 ENCOUNTER — Encounter (HOSPITAL_BASED_OUTPATIENT_CLINIC_OR_DEPARTMENT_OTHER)
Admission: RE | Disposition: A | Discharge status: Home / Self Care | Payer: Self-pay | Source: Ambulatory Visit | Attending: Orthopedic Surgery

## 2012-12-22 ENCOUNTER — Ambulatory Visit (HOSPITAL_BASED_OUTPATIENT_CLINIC_OR_DEPARTMENT_OTHER)
Admission: RE | Admit: 2012-12-22 | Discharge: 2012-12-22 | Disposition: A | Discharge status: Home / Self Care | Payer: BC Managed Care – PPO | Source: Ambulatory Visit | Attending: Orthopedic Surgery | Admitting: Orthopedic Surgery

## 2012-12-22 ENCOUNTER — Encounter (HOSPITAL_BASED_OUTPATIENT_CLINIC_OR_DEPARTMENT_OTHER): Payer: Self-pay | Admitting: *Deleted

## 2012-12-22 ENCOUNTER — Encounter (HOSPITAL_BASED_OUTPATIENT_CLINIC_OR_DEPARTMENT_OTHER): Payer: BC Managed Care – PPO | Admitting: Anesthesiology

## 2012-12-22 DIAGNOSIS — L089 Local infection of the skin and subcutaneous tissue, unspecified: Secondary | ICD-10-CM | POA: Insufficient documentation

## 2012-12-22 DIAGNOSIS — Z87891 Personal history of nicotine dependence: Secondary | ICD-10-CM | POA: Insufficient documentation

## 2012-12-22 HISTORY — PX: I & D EXTREMITY: SHX5045

## 2012-12-22 LAB — GRAM STAIN

## 2012-12-22 SURGERY — IRRIGATION AND DEBRIDEMENT EXTREMITY
Anesthesia: General | Site: Hand | Laterality: Left

## 2012-12-22 MED ORDER — OXYCODONE HCL 5 MG PO TABS
5.0000 mg | ORAL_TABLET | Freq: Once | ORAL | Status: AC | PRN
  Administered 2012-12-22: 5 mg via ORAL

## 2012-12-22 MED ORDER — CHLORHEXIDINE GLUCONATE 4 % EX LIQD: 60.0000 mL | Freq: Once | CUTANEOUS | Status: DC

## 2012-12-22 MED ORDER — HYDROMORPHONE HCL 2 MG PO TABS: 2.0000 mg | ORAL_TABLET | Freq: Four times a day (QID) | ORAL | Status: DC | PRN

## 2012-12-22 MED ORDER — PROMETHAZINE HCL 25 MG/ML IJ SOLN: 6.2500 mg | INTRAMUSCULAR | Status: DC | PRN

## 2012-12-22 MED ORDER — LACTATED RINGERS IV SOLN
INTRAVENOUS | Status: DC
  Administered 2012-12-22: 20 mL/h via INTRAVENOUS
  Administered 2012-12-22: 16:00:00 via INTRAVENOUS

## 2012-12-22 MED ORDER — HYDROMORPHONE HCL PF 1 MG/ML IJ SOLN
INTRAMUSCULAR | Status: AC
  Filled 2012-12-22: qty 1

## 2012-12-22 MED ORDER — DEXAMETHASONE SODIUM PHOSPHATE 10 MG/ML IJ SOLN
INTRAMUSCULAR | Status: DC | PRN
  Administered 2012-12-22: 10 mg via INTRAVENOUS

## 2012-12-22 MED ORDER — FENTANYL CITRATE 0.05 MG/ML IJ SOLN: 50.0000 ug | Freq: Once | INTRAMUSCULAR | Status: DC

## 2012-12-22 MED ORDER — MIDAZOLAM HCL 2 MG/2ML IJ SOLN
INTRAMUSCULAR | Status: AC
  Filled 2012-12-22: qty 2

## 2012-12-22 MED ORDER — FENTANYL CITRATE 0.05 MG/ML IJ SOLN
INTRAMUSCULAR | Status: DC | PRN
  Administered 2012-12-22 (×2): 50 ug via INTRAVENOUS
  Administered 2012-12-22: 100 ug via INTRAVENOUS

## 2012-12-22 MED ORDER — SULFAMETHOXAZOLE-TMP DS 800-160 MG PO TABS: 1.0000 | ORAL_TABLET | Freq: Two times a day (BID) | ORAL | Status: DC

## 2012-12-22 MED ORDER — ONDANSETRON HCL 4 MG/2ML IJ SOLN
INTRAMUSCULAR | Status: DC | PRN
  Administered 2012-12-22: 4 mg via INTRAVENOUS

## 2012-12-22 MED ORDER — OXYCODONE HCL 5 MG/5ML PO SOLN: 5.0000 mg | Freq: Once | ORAL | Status: AC | PRN

## 2012-12-22 MED ORDER — MIDAZOLAM HCL 2 MG/2ML IJ SOLN: 1.0000 mg | INTRAMUSCULAR | Status: DC | PRN

## 2012-12-22 MED ORDER — MIDAZOLAM HCL 5 MG/5ML IJ SOLN
INTRAMUSCULAR | Status: DC | PRN
  Administered 2012-12-22: 2 mg via INTRAVENOUS

## 2012-12-22 MED ORDER — LIDOCAINE HCL (CARDIAC) 20 MG/ML IV SOLN
INTRAVENOUS | Status: DC | PRN
  Administered 2012-12-22: 100 mg via INTRAVENOUS

## 2012-12-22 MED ORDER — PROPOFOL 10 MG/ML IV BOLUS
INTRAVENOUS | Status: AC
  Filled 2012-12-22: qty 20

## 2012-12-22 MED ORDER — BUPIVACAINE HCL (PF) 0.25 % IJ SOLN
INTRAMUSCULAR | Status: DC | PRN
  Administered 2012-12-22: 8 mL

## 2012-12-22 MED ORDER — OXYCODONE-ACETAMINOPHEN 5-325 MG PO TABS: ORAL_TABLET | ORAL | Status: DC

## 2012-12-22 MED ORDER — FENTANYL CITRATE 0.05 MG/ML IJ SOLN
INTRAMUSCULAR | Status: AC
  Filled 2012-12-22: qty 4

## 2012-12-22 MED ORDER — OXYCODONE HCL 5 MG PO TABS
ORAL_TABLET | ORAL | Status: AC
  Filled 2012-12-22: qty 1

## 2012-12-22 MED ORDER — PROPOFOL 10 MG/ML IV BOLUS
INTRAVENOUS | Status: DC | PRN
  Administered 2012-12-22: 200 mg via INTRAVENOUS

## 2012-12-22 MED ORDER — HYDROMORPHONE HCL PF 1 MG/ML IJ SOLN
0.2500 mg | INTRAMUSCULAR | Status: DC | PRN
  Administered 2012-12-22 (×4): 0.5 mg via INTRAVENOUS

## 2012-12-22 MED ORDER — VANCOMYCIN HCL IN DEXTROSE 500-5 MG/100ML-% IV SOLN
INTRAVENOUS | Status: AC
  Filled 2012-12-22: qty 100

## 2012-12-22 SURGICAL SUPPLY — 49 items
BAG DECANTER FOR FLEXI CONT (MISCELLANEOUS) IMPLANT
BANDAGE ELASTIC 3 VELCRO ST LF (GAUZE/BANDAGES/DRESSINGS) ×2 IMPLANT
BANDAGE GAUZE ELAST BULKY 4 IN (GAUZE/BANDAGES/DRESSINGS) ×2 IMPLANT
BANDAGE GAUZE STRT 1 STR LF (GAUZE/BANDAGES/DRESSINGS) IMPLANT
BLADE MINI RND TIP GREEN BEAV (BLADE) IMPLANT
BLADE SURG 15 STRL LF DISP TIS (BLADE) ×2 IMPLANT
BLADE SURG 15 STRL SS (BLADE) ×2
BNDG COHESIVE 1X5 TAN STRL LF (GAUZE/BANDAGES/DRESSINGS) IMPLANT
BNDG ELASTIC 2 VLCR STRL LF (GAUZE/BANDAGES/DRESSINGS) IMPLANT
BNDG ESMARK 4X9 LF (GAUZE/BANDAGES/DRESSINGS) ×2 IMPLANT
CHLORAPREP W/TINT 26ML (MISCELLANEOUS) IMPLANT
CORDS BIPOLAR (ELECTRODE) ×2 IMPLANT
COVER MAYO STAND STRL (DRAPES) ×2 IMPLANT
COVER TABLE BACK 60X90 (DRAPES) ×2 IMPLANT
CUFF TOURNIQUET SINGLE 18IN (TOURNIQUET CUFF) ×2 IMPLANT
DRAPE EXTREMITY T 121X128X90 (DRAPE) ×2 IMPLANT
DRAPE SURG 17X23 STRL (DRAPES) ×2 IMPLANT
GAUZE PACKING IODOFORM 1/4X5 (PACKING) ×2 IMPLANT
GAUZE XEROFORM 1X8 LF (GAUZE/BANDAGES/DRESSINGS) ×2 IMPLANT
GLOVE BIO SURGEON STRL SZ7.5 (GLOVE) ×2 IMPLANT
GLOVE BIOGEL PI IND STRL 8 (GLOVE) ×1 IMPLANT
GLOVE BIOGEL PI IND STRL 8.5 (GLOVE) ×1 IMPLANT
GLOVE BIOGEL PI INDICATOR 8 (GLOVE) ×1
GLOVE BIOGEL PI INDICATOR 8.5 (GLOVE) ×1
GLOVE SURG ORTHO 8.0 STRL STRW (GLOVE) ×2 IMPLANT
GOWN BRE IMP PREV XXLGXLNG (GOWN DISPOSABLE) ×4 IMPLANT
GOWN PREVENTION PLUS XLARGE (GOWN DISPOSABLE) ×2 IMPLANT
LOOP VESSEL MAXI BLUE (MISCELLANEOUS) IMPLANT
NEEDLE HYPO 25X1 1.5 SAFETY (NEEDLE) ×2 IMPLANT
NS IRRIG 1000ML POUR BTL (IV SOLUTION) ×2 IMPLANT
PACK BASIN DAY SURGERY FS (CUSTOM PROCEDURE TRAY) ×2 IMPLANT
PAD CAST 3X4 CTTN HI CHSV (CAST SUPPLIES) ×1 IMPLANT
PADDING CAST ABS 4INX4YD NS (CAST SUPPLIES) ×1
PADDING CAST ABS COTTON 4X4 ST (CAST SUPPLIES) ×1 IMPLANT
PADDING CAST COTTON 3X4 STRL (CAST SUPPLIES) ×1
SPLINT PLASTER CAST XFAST 3X15 (CAST SUPPLIES) ×30 IMPLANT
SPLINT PLASTER XTRA FASTSET 3X (CAST SUPPLIES) ×30
SPONGE GAUZE 4X4 12PLY (GAUZE/BANDAGES/DRESSINGS) ×2 IMPLANT
STOCKINETTE 4X48 STRL (DRAPES) ×2 IMPLANT
SUT ETHILON 4 0 PS 2 18 (SUTURE) ×2 IMPLANT
SWAB COLLECTION DEVICE MRSA (MISCELLANEOUS) ×2 IMPLANT
SYR BULB 3OZ (MISCELLANEOUS) ×2 IMPLANT
SYR CONTROL 10ML LL (SYRINGE) ×2 IMPLANT
TOWEL OR 17X24 6PK STRL BLUE (TOWEL DISPOSABLE) ×2 IMPLANT
TRAY DSU PREP LF (CUSTOM PROCEDURE TRAY) ×2 IMPLANT
TUBE ANAEROBIC SPECIMEN COL (MISCELLANEOUS) ×2 IMPLANT
TUBE CONNECTING 20X1/4 (TUBING) ×2 IMPLANT
TUBE FEEDING 5FR 15 INCH (TUBING) IMPLANT
UNDERPAD 30X30 INCONTINENT (UNDERPADS AND DIAPERS) ×2 IMPLANT

## 2012-12-22 NOTE — Anesthesia Preprocedure Evaluation (Signed)
Anesthesia Evaluation  Patient identified by MRN, date of birth, ID band Patient awake    Reviewed: Allergy & Precautions, H&P , NPO status , Patient's Chart, lab work & pertinent test results  Airway Mallampati: II TM Distance: >3 FB Neck ROM: Limited    Dental   Pulmonary COPDformer smoker,  + rhonchi         Cardiovascular Rhythm:Regular Rate:Normal     Neuro/Psych Cervical spinal fusion    GI/Hepatic GERD-  ,  Endo/Other    Renal/GU      Musculoskeletal   Abdominal   Peds  Hematology   Anesthesia Other Findings   Reproductive/Obstetrics                           Anesthesia Physical Anesthesia Plan  ASA: II  Anesthesia Plan: General   Post-op Pain Management:    Induction: Intravenous  Airway Management Planned: LMA  Additional Equipment:   Intra-op Plan:   Post-operative Plan: Extubation in OR  Informed Consent: I have reviewed the patients History and Physical, chart, labs and discussed the procedure including the risks, benefits and alternatives for the proposed anesthesia with the patient or authorized representative who has indicated his/her understanding and acceptance.     Plan Discussed with: CRNA and Surgeon  Anesthesia Plan Comments:         Anesthesia Quick Evaluation

## 2012-12-22 NOTE — Transfer of Care (Signed)
Immediate Anesthesia Transfer of Care Note  Patient: Chris Odom  Procedure(s) Performed: Procedure(s): IRRIGATION AND DEBRIDEMENT LEFT THUMB/HAND  (Left)  Patient Location: PACU  Anesthesia Type:General  Level of Consciousness: awake, alert  and oriented  Airway & Oxygen Therapy: Patient Spontanous Breathing and Patient connected to face mask oxygen  Post-op Assessment: Report given to PACU RN and Post -op Vital signs reviewed and stable  Post vital signs: Reviewed and stable  Complications: No apparent anesthesia complications

## 2012-12-22 NOTE — Anesthesia Postprocedure Evaluation (Signed)
  Anesthesia Post-op Note  Patient: Chris Odom  Procedure(s) Performed: Procedure(s): IRRIGATION AND DEBRIDEMENT LEFT THUMB/HAND  (Left)  Patient Location: PACU  Anesthesia Type:General  Level of Consciousness: awake and alert   Airway and Oxygen Therapy: Patient Spontanous Breathing  Post-op Pain: mild  Post-op Assessment: Post-op Vital signs reviewed, Patient's Cardiovascular Status Stable, Respiratory Function Stable, Patent Airway, No signs of Nausea or vomiting and Pain level controlled  Post-op Vital Signs: Reviewed and stable  Complications: No apparent anesthesia complications

## 2012-12-22 NOTE — H&P (Signed)
  Chris Odom is an 58 y.o. male.   Chief Complaint: left thumb/hand infection HPI: 57 yo male 2.5 weeks s/p I&D left thumb mp joint for infection.  Noted yesterday to have increased swelling, erythema, and pain of left thumb.  No fevers, chills, night sweats.  Had been on Augmentin.  Started on Bactrim yesterday.  Past Medical History  Diagnosis Date  . Chronic kidney disease     hx stones  . GERD (gastroesophageal reflux disease)   . COPD (chronic obstructive pulmonary disease)     a little -due to smoking- no problems recently  . Angina     Past Surgical History  Procedure Laterality Date  . Foot neuroma surgery  10    rt  . Hand surgery  98    rt hand gangion, wrist ganglion  . Knee arthroscopy  05    ganglion cyst  . Kidney stone surgery      cysto  . Cervical fusion      C 3 -C7  . Colonoscopy  2009    internal hemorrhoids  . Excisional hemorrhoidectomy  2014  . Incision and drainage abscess Left 12/04/2012    Procedure: INCISION AND DRAINAGE ABSCESS;  Surgeon: Tami Ribas, MD;  Location: [facility];  Service: Orthopedics;  Laterality: Left;  i and d np joint abcess  left thumb     Family History  Problem Relation Age of Onset  . Hypertension Mother   . Diabetes Father    Social History:  reports that he quit smoking about 7 years ago. He has never used smokeless tobacco. He reports that he does not drink alcohol or use illicit drugs.  Allergies: No Known Allergies  Medications Prior to Admission  Medication Sig Dispense Refill  . amoxicillin-clavulanate (AUGMENTIN) 875-125 MG per tablet Take 1 tablet by mouth 2 (two) times daily.  14 tablet  0  . Docusate Sodium (COLACE PO) Take 1 tablet by mouth daily.      Marland Kitchen oxyCODONE-acetaminophen (PERCOCET) 5-325 MG per tablet 1-2 tabs po q6 hours prn pain  40 tablet  0    No results found for this or any previous visit (from the past 48 hour(s)).  No results found.   A comprehensive review of  systems was negative except for: Eyes: positive for contacts/glasses  Blood pressure 163/93, pulse 70, temperature 98 F (36.7 C), temperature source Oral, resp. rate 16, height 6' (1.829 m), weight 205 lb (92.987 kg), SpO2 98.00%.  General appearance: alert, cooperative and appears stated age Head: Normocephalic, without obvious abnormality, atraumatic Neck: supple, symmetrical, trachea midline Resp: clear to auscultation bilaterally Cardio: regular rate and rhythm GI: non tender Extremities: intact sensation and capillary refill all digits.  +epl/fpl/io.  swelling and erythema of thear eminence.  pain with palpation mp joint and thenar eminence.  no pain of proximal phalanx or wrist.  no proximal streaks.   Pulses: 2+ and symmetric Skin: previous surgical wound radial side mp joint left thumb. Neurologic: Grossly normal Incision/Wound: As above  Assessment/Plan Left thumb/hand infection.  Plan repeat I&D.  Risks, benefits, and alternatives of surgery were discussed and the patient agrees with the plan of care.   Duana Benedict R 12/22/2012, 2:44 PM

## 2012-12-22 NOTE — Brief Op Note (Signed)
12/22/2012  3:37 PM  PATIENT:  Chris Odom  57 y.o. male  PRE-OPERATIVE DIAGNOSIS:  LEFT THUMB/ HAND INFECTION   POST-OPERATIVE DIAGNOSIS:  LEFT THUMB/ HAND INFECTION   PROCEDURE:  Procedure(s): IRRIGATION AND DEBRIDEMENT LEFT THUMB/HAND  (Left)  SURGEON:  Surgeon(s) and Role:    * Tami Ribas, MD - Primary    * Nicki Reaper, MD - Assisting  PHYSICIAN ASSISTANT:   ASSISTANTS: Cindee Salt, MD   ANESTHESIA:   general  EBL:  Total I/O In: 1000 [I.V.:1000] Out: -   BLOOD ADMINISTERED:none  DRAINS: iodoform packing  LOCAL MEDICATIONS USED:  MARCAINE     SPECIMEN:  Source of Specimen:  left thenar eminence  DISPOSITION OF SPECIMEN:  micro  COUNTS:  YES  TOURNIQUET:    DICTATION: .Other Dictation: Dictation Number [id number]  PLAN OF CARE: Discharge to home after PACU  PATIENT DISPOSITION:  PACU - hemodynamically stable.

## 2012-12-22 NOTE — Anesthesia Procedure Notes (Signed)
Procedure Name: LMA Insertion Date/Time: 12/22/2012 3:06 PM Performed by: Burna Cash Pre-anesthesia Checklist: Patient identified, Emergency Drugs available, Suction available and Patient being monitored Patient Re-evaluated:Patient Re-evaluated prior to inductionOxygen Delivery Method: Circle System Utilized Preoxygenation: Pre-oxygenation with 100% oxygen Intubation Type: IV induction Ventilation: Mask ventilation without difficulty LMA: LMA inserted LMA Size: 5.0 Number of attempts: 1 Airway Equipment and Method: bite block Placement Confirmation: positive ETCO2 Tube secured with: Tape Dental Injury: Teeth and Oropharynx as per pre-operative assessment

## 2012-12-22 NOTE — Op Note (Signed)
[id number] 

## 2012-12-23 ENCOUNTER — Encounter (HOSPITAL_BASED_OUTPATIENT_CLINIC_OR_DEPARTMENT_OTHER): Payer: Self-pay | Admitting: Orthopedic Surgery

## 2012-12-23 LAB — POCT HEMOGLOBIN-HEMACUE: Hemoglobin: 14.1 g/dL (ref 13.0–17.0)

## 2012-12-23 NOTE — Op Note (Signed)
Chris Odom, EDMISTER NO.:  [id number]  MEDICAL RECORD NO.:  [id number]  LOCATION:                                 FACILITY:  PHYSICIAN:  Betha Loa, MD             DATE OF BIRTH:  DATE OF PROCEDURE:  12/22/2012 DATE OF DISCHARGE:                              OPERATIVE REPORT   PREOPERATIVE DIAGNOSIS:  Left thumb and thenar eminence infection.  POSTOPERATIVE DIAGNOSIS:  Left thumb and thenar eminence infection.  PROCEDURE:  Irrigation and debridement of left thumb and thenar eminence.  SURGEON:  Betha Loa, MD  ASSISTANT:  Cindee Salt, MD  ANESTHESIA:  General.  IV FLUIDS:  Per anesthesia flow sheet.  ESTIMATED BLOOD LOSS:  Minimal.  COMPLICATIONS:  None.  SPECIMENS:  None.  TOURNIQUET TIME:  Approximately 20 minutes.  DISPOSITION:  Stable to PACU.  INDICATIONS:  [name] is a 57 year old male who 2-1/2 weeks ago underwent incision and drainage of the left thumb MP joint for an abscess.  He had been doing well but in the past couple of days he noted increased swelling, pain, and erythema of the thenar eminence.  He was started on Bactrim yesterday.  He presented to surgical center today and on evaluation was felt it was appropriate for repeat irrigation and debridement.  Risks, benefits, and alternatives of surgery were discussed including the risk of blood loss, infection, damage to nerves, vessels, tendons, ligaments, bone, failure of surgery, need for additional surgery, complications with wound healing, continued pain, continued infection, need for repeat irrigation and debridement.  He voiced understanding of these risks and elected to proceed.  OPERATIVE COURSE:  After being identified preoperatively by myself, the patient and I agreed upon procedure and site of procedure.  Surgical site was marked.  The risks, benefits, and alternatives of surgery were reviewed and he wished to proceed.  Surgical consent had been signed. He was  transferred to the operating room and placed on the operating room table in supine position with left upper extremity on arm board. General anesthesia was induced by anesthesiologist.  Left upper extremity was prepped and draped in normal sterile orthopedic fashion. A surgical pause was performed between surgeons, anesthesia and operating room staff, and all were in agreement as to the patient, procedure, and site of procedure.  The previous wound was entered.  The joint was visualized.  There was no gross purulence within the joint. The subcutaneous tissues in the thenar eminence were entered as well.  There was purulence in this area.  Cultures were taken for aerobes, anaerobes, AFB, and fungus.  An incision was made over the thenar eminence to provide additional source of drainage.  The wounds were all copiously irrigated with sterile saline.  A 1000 mL was used. The wounds were then packed open with quarter-inch iodoform gauze.  An 8 mL of 0.25% plain Marcaine was injected to aid in postoperative analgesia.  The wounds were then dressed with sterile Xeroform, 4x4s, and wrapped with a Kerlix bandage.  A thumb spica splint was placed and wrapped with Kerlix and ACE bandage.  The tourniquet was deflated at approximately 20 minutes.  The fingertips were pink with brisk capillary refill after deflation of tourniquet.  Operative drapes were broken down.  The patient was awoken from anesthesia safely.  He was transferred back to the stretcher and taken to the PACU in stable condition.  He will continue on Bactrim DS 1 p.o. b.i.d.  A prescription for Percocet 5/325, 1-2 p.o. q.6 hours p.r.n. pain, dispensed #40.  I will see him back in the office in 3 days.     Betha Loa, MD     KK/MEDQ  D:  12/22/2012  T:  12/23/2012  Job:  [id number]

## 2012-12-23 NOTE — Addendum Note (Signed)
Addendum created 12/23/12 9147 by Tonette Bihari   Modules edited: Anesthesia Responsible Staff

## 2012-12-25 LAB — CULTURE, ROUTINE-ABSCESS: Culture: NO GROWTH

## 2012-12-27 LAB — ANAEROBIC CULTURE

## 2012-12-29 ENCOUNTER — Encounter: Payer: Self-pay | Admitting: Internal Medicine

## 2012-12-29 ENCOUNTER — Ambulatory Visit (INDEPENDENT_AMBULATORY_CARE_PROVIDER_SITE_OTHER): Payer: BC Managed Care – PPO | Admitting: Internal Medicine

## 2012-12-29 VITALS — BP 129/86 | HR 59 | Temp 98.2°F | Wt 211.0 lb

## 2012-12-29 DIAGNOSIS — IMO0002 Reserved for concepts with insufficient information to code with codable children: Secondary | ICD-10-CM

## 2012-12-29 DIAGNOSIS — L089 Local infection of the skin and subcutaneous tissue, unspecified: Secondary | ICD-10-CM

## 2012-12-29 LAB — C-REACTIVE PROTEIN: CRP: 1.3 mg/dL — ABNORMAL HIGH (ref <0.60)

## 2012-12-29 LAB — BASIC METABOLIC PANEL WITH GFR
BUN: 19 mg/dL (ref 6–23)
Calcium: 9.9 mg/dL (ref 8.4–10.5)
GFR, Est African American: 80 mL/min
GFR, Est Non African American: 69 mL/min
Glucose, Bld: 78 mg/dL (ref 70–99)
Potassium: 5.2 mEq/L (ref 3.5–5.3)
Sodium: 135 mEq/L (ref 135–145)

## 2012-12-29 MED ORDER — SULFAMETHOXAZOLE-TMP DS 800-160 MG PO TABS: 1.0000 | ORAL_TABLET | Freq: Two times a day (BID) | ORAL | Status: DC

## 2012-12-29 NOTE — Progress Notes (Signed)
Subjective:    Patient ID: Chris Odom, male    DOB: October 05, 1955, 57 y.o.   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 As days progressed he would clean with Hydrogen peroxide. He continued to trap and unclear if pond water went into his wound  Over the week it started to worsen, with swelling, redness, indurated, tender with movement. No draining of pus. Went to UC on 11/12, gave an injection of antibiotics. Then saw PCP who gave him another oral antibiotics. 11/13, and then referred to Dr. Merlyn Lot 11/14 where he had 1 X D of thumb abscess and hydrotherapy plus amox/clav but hand lesion continues to progressed in swelling and wound bed Was not healing well. he then had 2nd surgery on 12/2 I x D of thumb and thenar. He was placed on bactrim DS 1 BID and continues with packing and hydrotherapy. Pictures show improvement at this visit. Reviewed records from Dr. Merrilee Seashore office.  No Known Allergies Current Outpatient Prescriptions on File Prior to Visit  Medication Sig Dispense Refill  . Docusate Sodium (COLACE PO) Take 1 tablet by mouth daily.      Marland Kitchen HYDROmorphone (DILAUDID) 2 MG tablet Take 1 tablet (2 mg total) by mouth every 6 (six) hours as needed for severe pain (prn breakthrough pain).  15 tablet  0  . sulfamethoxazole-trimethoprim (BACTRIM DS) 800-160 MG per tablet Take 1 tablet by mouth 2 (two) times daily.  14 tablet  0  . oxyCODONE-acetaminophen (PERCOCET) 5-325 MG per tablet 1-2 tabs po q6 hours prn pain  40 tablet  0   No current facility-administered medications on file prior to visit.   Active Ambulatory Problems    Diagnosis Date Noted  . No Active Ambulatory Problems   Resolved Ambulatory Problems    Diagnosis Date Noted  . No Resolved Ambulatory Problems   Past Medical History  Diagnosis Date  . Chronic kidney disease   . GERD (gastroesophageal reflux disease)   . COPD  (chronic obstructive pulmonary disease)   . Angina      Soc hx: works part-time 10 hrs at [employer] in [employer]. Trapping/hunting as a hobby. Retired Psychologist, prison and probation services warden  family history includes Diabetes in his father; Hypertension in his mother. Review of Systems Review of Systems  Constitutional: Negative for fever, chills, diaphoresis, activity change, appetite change, fatigue and unexpected weight change.  HENT: Negative for congestion, sore throat, rhinorrhea, sneezing, trouble swallowing and sinus pressure.  Eyes: Negative for photophobia and visual disturbance.  Respiratory: Negative for cough, chest tightness, shortness of breath, wheezing and stridor.  Cardiovascular: Negative for chest pain, palpitations and leg swelling.  Gastrointestinal: Negative for nausea, vomiting, abdominal pain, diarrhea, constipation, blood in stool, abdominal distention and anal bleeding.  Genitourinary: Negative for dysuria, hematuria, flank pain and difficulty urinating.  Musculoskeletal: Negative for myalgias, back pain, joint swelling, arthralgias and gait problem.  Skin: + wound per hpi Neurological: Negative for dizziness, tremors, weakness and light-headedness.  Hematological: Negative for adenopathy. Does not bruise/bleed easily.  Psychiatric/Behavioral: Negative for behavioral problems, confusion, sleep disturbance, dysphoric mood, decreased concentration and agitation.        Objective:   Physical Exam BP 129/86  Pulse 59  Temp(Src) 98.2 F (36.8 C) (Oral)  Wt 211 lb (95.709 kg) gen = a xo by 3 in NAD Skin = hand is wrapped still has limited range of motion to left thumb, tenderness with  full range of motion.  Labs: Lab Results  Component Value Date   ESRSEDRATE 12 12/29/2012   Lab Results  Component Value Date   CRP 1.3* 12/29/2012        Assessment & Plan:   deep tissue infection, culture negative = continue with wound care to continue on m-w-f; still felt to be deep tissue  infection, not involving joints or bone.   - Will check sed rate and crp. Will refill bactrim DS 1 tab BID for an additional 2 wks.to continue completion of 6 wks - see back in 2 wks.-

## 2013-01-04 ENCOUNTER — Encounter (HOSPITAL_BASED_OUTPATIENT_CLINIC_OR_DEPARTMENT_OTHER): Payer: Self-pay | Admitting: *Deleted

## 2013-01-04 ENCOUNTER — Other Ambulatory Visit: Payer: Self-pay | Admitting: Orthopedic Surgery

## 2013-01-04 NOTE — Progress Notes (Signed)
Has had this finger i/d x2

## 2013-01-05 ENCOUNTER — Encounter (HOSPITAL_BASED_OUTPATIENT_CLINIC_OR_DEPARTMENT_OTHER): Payer: BC Managed Care – PPO | Admitting: Anesthesiology

## 2013-01-05 ENCOUNTER — Ambulatory Visit (HOSPITAL_BASED_OUTPATIENT_CLINIC_OR_DEPARTMENT_OTHER): Payer: BC Managed Care – PPO | Admitting: Anesthesiology

## 2013-01-05 ENCOUNTER — Encounter (HOSPITAL_BASED_OUTPATIENT_CLINIC_OR_DEPARTMENT_OTHER): Payer: Self-pay | Admitting: *Deleted

## 2013-01-05 ENCOUNTER — Ambulatory Visit (HOSPITAL_BASED_OUTPATIENT_CLINIC_OR_DEPARTMENT_OTHER)
Admission: RE | Admit: 2013-01-05 | Discharge: 2013-01-05 | Disposition: A | Discharge status: Home / Self Care | Payer: BC Managed Care – PPO | Source: Ambulatory Visit | Attending: Orthopedic Surgery | Admitting: Orthopedic Surgery

## 2013-01-05 ENCOUNTER — Encounter (HOSPITAL_BASED_OUTPATIENT_CLINIC_OR_DEPARTMENT_OTHER)
Admission: RE | Disposition: A | Discharge status: Home / Self Care | Payer: Self-pay | Source: Ambulatory Visit | Attending: Orthopedic Surgery

## 2013-01-05 DIAGNOSIS — Z87442 Personal history of urinary calculi: Secondary | ICD-10-CM | POA: Insufficient documentation

## 2013-01-05 DIAGNOSIS — M869 Osteomyelitis, unspecified: Secondary | ICD-10-CM | POA: Insufficient documentation

## 2013-01-05 DIAGNOSIS — L02519 Cutaneous abscess of unspecified hand: Secondary | ICD-10-CM | POA: Insufficient documentation

## 2013-01-05 DIAGNOSIS — J449 Chronic obstructive pulmonary disease, unspecified: Secondary | ICD-10-CM | POA: Insufficient documentation

## 2013-01-05 DIAGNOSIS — L03019 Cellulitis of unspecified finger: Secondary | ICD-10-CM | POA: Insufficient documentation

## 2013-01-05 DIAGNOSIS — J4489 Other specified chronic obstructive pulmonary disease: Secondary | ICD-10-CM | POA: Insufficient documentation

## 2013-01-05 DIAGNOSIS — M009 Pyogenic arthritis, unspecified: Secondary | ICD-10-CM | POA: Insufficient documentation

## 2013-01-05 DIAGNOSIS — K219 Gastro-esophageal reflux disease without esophagitis: Secondary | ICD-10-CM | POA: Insufficient documentation

## 2013-01-05 DIAGNOSIS — I209 Angina pectoris, unspecified: Secondary | ICD-10-CM | POA: Insufficient documentation

## 2013-01-05 HISTORY — PX: INCISION AND DRAINAGE ABSCESS: SHX5864

## 2013-01-05 LAB — POCT HEMOGLOBIN-HEMACUE: Hemoglobin: 13.1 g/dL (ref 13.0–17.0)

## 2013-01-05 SURGERY — INCISION AND DRAINAGE, ABSCESS
Anesthesia: General | Site: Thumb | Laterality: Left

## 2013-01-05 MED ORDER — FENTANYL CITRATE 0.05 MG/ML IJ SOLN
INTRAMUSCULAR | Status: AC
  Filled 2013-01-05: qty 2

## 2013-01-05 MED ORDER — OXYCODONE HCL 5 MG/5ML PO SOLN: 5.0000 mg | Freq: Once | ORAL | Status: AC | PRN

## 2013-01-05 MED ORDER — 0.9 % SODIUM CHLORIDE (POUR BTL) OPTIME
TOPICAL | Status: DC | PRN
  Administered 2013-01-05: 1500 mL

## 2013-01-05 MED ORDER — CHLORHEXIDINE GLUCONATE 4 % EX LIQD: 60.0000 mL | Freq: Once | CUTANEOUS | Status: DC

## 2013-01-05 MED ORDER — LACTATED RINGERS IV SOLN
INTRAVENOUS | Status: DC
  Administered 2013-01-05: 10 mL/h via INTRAVENOUS
  Administered 2013-01-05 (×2): via INTRAVENOUS

## 2013-01-05 MED ORDER — PROPOFOL 10 MG/ML IV BOLUS
INTRAVENOUS | Status: DC | PRN
  Administered 2013-01-05: 150 mg via INTRAVENOUS

## 2013-01-05 MED ORDER — OXYCODONE-ACETAMINOPHEN 10-325 MG PO TABS: ORAL_TABLET | ORAL | Status: DC

## 2013-01-05 MED ORDER — FENTANYL CITRATE 0.05 MG/ML IJ SOLN
INTRAMUSCULAR | Status: DC | PRN
  Administered 2013-01-05: 100 ug via INTRAVENOUS
  Administered 2013-01-05 (×2): 50 ug via INTRAVENOUS

## 2013-01-05 MED ORDER — BUPIVACAINE HCL (PF) 0.25 % IJ SOLN
INTRAMUSCULAR | Status: AC
  Filled 2013-01-05: qty 30

## 2013-01-05 MED ORDER — MIDAZOLAM HCL 2 MG/2ML IJ SOLN
INTRAMUSCULAR | Status: AC
  Filled 2013-01-05: qty 2

## 2013-01-05 MED ORDER — HYDROMORPHONE HCL PF 1 MG/ML IJ SOLN
INTRAMUSCULAR | Status: AC
  Filled 2013-01-05: qty 1

## 2013-01-05 MED ORDER — FENTANYL CITRATE 0.05 MG/ML IJ SOLN
50.0000 ug | INTRAMUSCULAR | Status: DC | PRN
  Administered 2013-01-05 (×2): 50 ug via INTRAVENOUS

## 2013-01-05 MED ORDER — VANCOMYCIN HCL 1000 MG IV SOLR
1000.0000 mg | INTRAVENOUS | Status: DC | PRN
  Administered 2013-01-05: 1000 mg via INTRAVENOUS

## 2013-01-05 MED ORDER — MIDAZOLAM HCL 5 MG/5ML IJ SOLN
INTRAMUSCULAR | Status: DC | PRN
  Administered 2013-01-05: 2 mg via INTRAVENOUS

## 2013-01-05 MED ORDER — BUPIVACAINE HCL (PF) 0.25 % IJ SOLN
INTRAMUSCULAR | Status: DC | PRN
  Administered 2013-01-05: 8 mL

## 2013-01-05 MED ORDER — OXYCODONE HCL 5 MG PO TABS: 5.0000 mg | ORAL_TABLET | Freq: Once | ORAL | Status: AC | PRN

## 2013-01-05 MED ORDER — ONDANSETRON HCL 4 MG/2ML IJ SOLN
INTRAMUSCULAR | Status: DC | PRN
  Administered 2013-01-05: 4 mg via INTRAVENOUS

## 2013-01-05 MED ORDER — MIDAZOLAM HCL 2 MG/2ML IJ SOLN: 1.0000 mg | INTRAMUSCULAR | Status: DC | PRN

## 2013-01-05 MED ORDER — LIDOCAINE HCL (CARDIAC) 20 MG/ML IV SOLN
INTRAVENOUS | Status: DC | PRN
  Administered 2013-01-05: 40 mg via INTRAVENOUS

## 2013-01-05 MED ORDER — FENTANYL CITRATE 0.05 MG/ML IJ SOLN
INTRAMUSCULAR | Status: AC
  Filled 2013-01-05: qty 4

## 2013-01-05 MED ORDER — DEXAMETHASONE SODIUM PHOSPHATE 10 MG/ML IJ SOLN
INTRAMUSCULAR | Status: DC | PRN
  Administered 2013-01-05: 10 mg via INTRAVENOUS

## 2013-01-05 MED ORDER — HYDROMORPHONE HCL PF 1 MG/ML IJ SOLN
0.2500 mg | INTRAMUSCULAR | Status: DC | PRN
  Administered 2013-01-05 (×5): 0.5 mg via INTRAVENOUS

## 2013-01-05 SURGICAL SUPPLY — 53 items
BAG DECANTER FOR FLEXI CONT (MISCELLANEOUS) IMPLANT
BANDAGE ELASTIC 3 VELCRO ST LF (GAUZE/BANDAGES/DRESSINGS) ×2 IMPLANT
BANDAGE GAUZE STRT 1 STR LF (GAUZE/BANDAGES/DRESSINGS) IMPLANT
BLADE MINI RND TIP GREEN BEAV (BLADE) IMPLANT
BLADE SURG 15 STRL LF DISP TIS (BLADE) ×3 IMPLANT
BLADE SURG 15 STRL SS (BLADE) ×3
BNDG COHESIVE 1X5 TAN STRL LF (GAUZE/BANDAGES/DRESSINGS) IMPLANT
BNDG ELASTIC 2 VLCR STRL LF (GAUZE/BANDAGES/DRESSINGS) IMPLANT
BNDG ESMARK 4X9 LF (GAUZE/BANDAGES/DRESSINGS) ×2 IMPLANT
BNDG GAUZE ELAST 4 BULKY (GAUZE/BANDAGES/DRESSINGS) ×2 IMPLANT
BRUSH SCRUB EZ PLAIN DRY (MISCELLANEOUS) ×2 IMPLANT
CHLORAPREP W/TINT 26ML (MISCELLANEOUS) ×2 IMPLANT
CORDS BIPOLAR (ELECTRODE) IMPLANT
COVER MAYO STAND STRL (DRAPES) ×2 IMPLANT
COVER TABLE BACK 60X90 (DRAPES) ×2 IMPLANT
CUFF TOURNIQUET SINGLE 18IN (TOURNIQUET CUFF) ×2 IMPLANT
DRAPE EXTREMITY T 121X128X90 (DRAPE) ×2 IMPLANT
DRAPE SURG 17X23 STRL (DRAPES) ×2 IMPLANT
GAUZE PACKING IODOFORM 1/4X5 (PACKING) ×2 IMPLANT
GAUZE XEROFORM 1X8 LF (GAUZE/BANDAGES/DRESSINGS) ×2 IMPLANT
GLOVE BIO SURGEON STRL SZ7 (GLOVE) ×2 IMPLANT
GLOVE BIO SURGEON STRL SZ7.5 (GLOVE) ×2 IMPLANT
GLOVE BIOGEL PI IND STRL 7.5 (GLOVE) ×1 IMPLANT
GLOVE BIOGEL PI IND STRL 8 (GLOVE) ×1 IMPLANT
GLOVE BIOGEL PI IND STRL 8.5 (GLOVE) ×1 IMPLANT
GLOVE BIOGEL PI INDICATOR 7.5 (GLOVE) ×1
GLOVE BIOGEL PI INDICATOR 8 (GLOVE) ×1
GLOVE BIOGEL PI INDICATOR 8.5 (GLOVE) ×1
GLOVE SURG ORTHO 8.0 STRL STRW (GLOVE) ×2 IMPLANT
GOWN BRE IMP PREV XXLGXLNG (GOWN DISPOSABLE) ×4 IMPLANT
GOWN PREVENTION PLUS XLARGE (GOWN DISPOSABLE) ×2 IMPLANT
LOOP VESSEL MAXI BLUE (MISCELLANEOUS) IMPLANT
NEEDLE HYPO 25X1 1.5 SAFETY (NEEDLE) IMPLANT
NS IRRIG 1000ML POUR BTL (IV SOLUTION) ×2 IMPLANT
PACK BASIN DAY SURGERY FS (CUSTOM PROCEDURE TRAY) ×2 IMPLANT
PAD CAST 3X4 CTTN HI CHSV (CAST SUPPLIES) IMPLANT
PADDING CAST ABS 4INX4YD NS (CAST SUPPLIES) ×1
PADDING CAST ABS COTTON 4X4 ST (CAST SUPPLIES) ×1 IMPLANT
PADDING CAST COTTON 3X4 STRL (CAST SUPPLIES)
SPLINT PLASTER CAST XFAST 3X15 (CAST SUPPLIES) IMPLANT
SPLINT PLASTER CAST XFAST 4X15 (CAST SUPPLIES) ×10 IMPLANT
SPLINT PLASTER XTRA FAST SET 4 (CAST SUPPLIES) ×10
SPLINT PLASTER XTRA FASTSET 3X (CAST SUPPLIES)
SPONGE GAUZE 4X4 12PLY (GAUZE/BANDAGES/DRESSINGS) ×2 IMPLANT
STOCKINETTE 4X48 STRL (DRAPES) ×2 IMPLANT
SUT ETHILON 4 0 PS 2 18 (SUTURE) IMPLANT
SWAB COLLECTION DEVICE MRSA (MISCELLANEOUS) ×2 IMPLANT
SYR BULB 3OZ (MISCELLANEOUS) ×2 IMPLANT
SYR CONTROL 10ML LL (SYRINGE) ×2 IMPLANT
TOWEL OR 17X24 6PK STRL BLUE (TOWEL DISPOSABLE) ×4 IMPLANT
TUBE ANAEROBIC SPECIMEN COL (MISCELLANEOUS) ×2 IMPLANT
TUBE FEEDING 5FR 15 INCH (TUBING) IMPLANT
UNDERPAD 30X30 INCONTINENT (UNDERPADS AND DIAPERS) ×2 IMPLANT

## 2013-01-05 NOTE — Anesthesia Preprocedure Evaluation (Signed)
Anesthesia Evaluation  Patient identified by MRN, date of birth, ID band Patient awake    Reviewed: Allergy & Precautions, H&P , NPO status , Patient's Chart, lab work & pertinent test results  Airway Mallampati: I TM Distance: >3 FB Neck ROM: Full    Dental no notable dental hx. (+) Teeth Intact and Dental Advisory Given   Pulmonary COPDformer smoker,  breath sounds clear to auscultation  Pulmonary exam normal       Cardiovascular negative cardio ROS  Rhythm:Regular Rate:Normal     Neuro/Psych negative neurological ROS  negative psych ROS   GI/Hepatic Neg liver ROS, GERD-  Controlled,  Endo/Other  negative endocrine ROS  Renal/GU negative Renal ROS  negative genitourinary   Musculoskeletal   Abdominal   Peds  Hematology negative hematology ROS (+)   Anesthesia Other Findings   Reproductive/Obstetrics negative OB ROS                           Anesthesia Physical Anesthesia Plan  ASA: II  Anesthesia Plan: General   Post-op Pain Management:    Induction: Intravenous  Airway Management Planned: LMA  Additional Equipment:   Intra-op Plan:   Post-operative Plan: Extubation in OR  Informed Consent: I have reviewed the patients History and Physical, chart, labs and discussed the procedure including the risks, benefits and alternatives for the proposed anesthesia with the patient or authorized representative who has indicated his/her understanding and acceptance.   Dental advisory given  Plan Discussed with: CRNA  Anesthesia Plan Comments:         Anesthesia Quick Evaluation

## 2013-01-05 NOTE — Transfer of Care (Signed)
Immediate Anesthesia Transfer of Care Note  Patient: Chris Odom  Procedure(s) Performed: Procedure(s): INCISION AND DRAINAGE LEFT THUMB (Left)  Patient Location: PACU  Anesthesia Type:General  Level of Consciousness: awake  Airway & Oxygen Therapy: Patient Spontanous Breathing and Patient connected to face mask oxygen  Post-op Assessment: Report given to PACU RN and Post -op Vital signs reviewed and stable  Post vital signs: Reviewed and stable  Complications: No apparent anesthesia complications

## 2013-01-05 NOTE — Brief Op Note (Signed)
01/05/2013  5:22 PM  PATIENT:  Chris Odom  57 y.o. male  PRE-OPERATIVE DIAGNOSIS:  infected left thumb, abscess and infected mp joint  POST-OPERATIVE DIAGNOSIS:  infected left thumb, abscess and infected mp joint, osteomyelitis  PROCEDURE:  Procedure(s): INCISION AND DRAINAGE LEFT THUMB (Left)  SURGEON:  Surgeon(s) and Role:    * Tami Ribas, MD - Primary    * Nicki Reaper, MD - Assisting  PHYSICIAN ASSISTANT:   ASSISTANTS: Cindee Salt, MD   ANESTHESIA:   general  EBL:  Total I/O In: 1300 [I.V.:1300] Out: -   BLOOD ADMINISTERED:none  DRAINS: iodoform packing  LOCAL MEDICATIONS USED:  MARCAINE     SPECIMEN:  Source of Specimen:  left thumb mp joint  DISPOSITION OF SPECIMEN:  micro  COUNTS:  YES  TOURNIQUET:   Total Tourniquet Time Documented: Upper Arm (Left) - 17 minutes Total: Upper Arm (Left) - 17 minutes   DICTATION: .Other Dictation: Dictation Number [id number]  PLAN OF CARE: Discharge to home after PACU  PATIENT DISPOSITION:  PACU - hemodynamically stable.

## 2013-01-05 NOTE — Op Note (Signed)
[id number] 

## 2013-01-05 NOTE — Anesthesia Postprocedure Evaluation (Signed)
  Anesthesia Post-op Note  Patient: Chris Odom  Procedure(s) Performed: Procedure(s): INCISION AND DRAINAGE LEFT THUMB (Left)  Patient Location: PACU  Anesthesia Type:General  Level of Consciousness: awake, oriented, sedated and patient cooperative  Airway and Oxygen Therapy: Patient Spontanous Breathing  Post-op Pain: mild  Post-op Assessment: Post-op Vital signs reviewed, Patient's Cardiovascular Status Stable, Respiratory Function Stable, Patent Airway, No signs of Nausea or vomiting and Pain level controlled  Post-op Vital Signs: stable  Complications: No apparent anesthesia complications

## 2013-01-05 NOTE — Anesthesia Procedure Notes (Signed)
Procedure Name: LMA Insertion Date/Time: 01/05/2013 4:52 PM Performed by: Burna Cash Pre-anesthesia Checklist: Patient identified, Emergency Drugs available, Suction available and Patient being monitored Patient Re-evaluated:Patient Re-evaluated prior to inductionOxygen Delivery Method: Circle System Utilized Preoxygenation: Pre-oxygenation with 100% oxygen Intubation Type: IV induction Ventilation: Mask ventilation without difficulty LMA: LMA inserted LMA Size: 5.0 Number of attempts: 1 Airway Equipment and Method: bite block Placement Confirmation: positive ETCO2 Tube secured with: Tape Dental Injury: Teeth and Oropharynx as per pre-operative assessment

## 2013-01-05 NOTE — H&P (Signed)
  Chris Odom is an 57 y.o. male.   Chief Complaint: left thumb infection HPI: 57 yo rhd male with continued infection left thumb after I&D x 2.  Has had improvement after I&D's then with new abscess formation.  Now with new fluctuant area dorso ulnar aspect at mp joint.  No fevers, chills, night sweats.  Past Medical History  Diagnosis Date  . Chronic kidney disease     hx stones  . GERD (gastroesophageal reflux disease)   . COPD (chronic obstructive pulmonary disease)     a little -due to smoking- no problems recently  . Angina     Past Surgical History  Procedure Laterality Date  . Foot neuroma surgery  10    rt  . Hand surgery  98    rt hand gangion, wrist ganglion  . Knee arthroscopy  05    ganglion cyst  . Kidney stone surgery      cysto  . Cervical fusion  01/2011    C 3 -C7  . Colonoscopy  2009    internal hemorrhoids  . Excisional hemorrhoidectomy  2014  . Incision and drainage abscess Left 12/04/2012    Procedure: INCISION AND DRAINAGE ABSCESS;  Surgeon: Tami Ribas, MD;  Location: [facility];  Service: Orthopedics;  Laterality: Left;  i and d np joint abcess  left thumb   . I&d extremity Left 12/22/2012    Procedure: IRRIGATION AND DEBRIDEMENT LEFT THUMB/HAND ;  Surgeon: Tami Ribas, MD;  Location: [facility];  Service: Orthopedics;  Laterality: Left;    Family History  Problem Relation Age of Onset  . Hypertension Mother   . Diabetes Father    Social History:  reports that he quit smoking about 7 years ago. He has never used smokeless tobacco. He reports that he does not drink alcohol or use illicit drugs.  Allergies: No Known Allergies  No prescriptions prior to admission    No results found for this or any previous visit (from the past 48 hour(s)).  No results found.   A comprehensive review of systems was negative except for: Eyes: positive for contacts/glasses  Height 6' (1.829 m), weight 211 lb (95.709  kg).  General appearance: alert, cooperative and appears stated age Head: Normocephalic, without obvious abnormality, atraumatic Neck: supple, symmetrical, trachea midline Resp: clear to auscultation bilaterally Cardio: regular rate and rhythm GI: non tender Extremities: intact sensation and capillary refill all digits. +epl/fpl/io.  ttp dorsoulnar side of left thumb at mp joint.  fluctuant area with erythema.  no proximal streaks. volar wound healing well.  radial wound with some purulence. Pulses: 2+ and symmetric Skin: as above Neurologic: Grossly normal Incision/Wound: As above  Assessment/Plan Left thumb abscess.  Recommend repeat I&D.  Risks, benefits, and alternatives of surgery were discussed and the patient agrees with the plan of care.   Chris Odom R 01/05/2013, 10:01 AM

## 2013-01-06 ENCOUNTER — Other Ambulatory Visit: Payer: Self-pay | Admitting: Internal Medicine

## 2013-01-06 ENCOUNTER — Encounter: Payer: Self-pay | Admitting: *Deleted

## 2013-01-06 ENCOUNTER — Telehealth: Payer: Self-pay | Admitting: *Deleted

## 2013-01-06 DIAGNOSIS — M869 Osteomyelitis, unspecified: Secondary | ICD-10-CM | POA: Insufficient documentation

## 2013-01-06 NOTE — Op Note (Signed)
Chris Odom, Chris Odom NO.:  [id number]  MEDICAL RECORD NO.:  [id number]  LOCATION:                                 FACILITY:  PHYSICIAN:  Betha Loa, MD             DATE OF BIRTH:  DATE OF PROCEDURE:  01/05/2013 DATE OF DISCHARGE:                              OPERATIVE REPORT   PREOPERATIVE DIAGNOSIS:  Left thumb and MP joint infection.  POSTOPERATIVE DIAGNOSIS:  Left thumb abscess and MP joint infection and osteomyelitis of the metacarpal head and proximal phalanx base.  PROCEDURE:  Incision and drainage, left thumb abscess; repeat I and D of MP joint; I and D of osteomyelitis of the metacarpal head and proximal phalanx.  SURGEON:  Betha Loa, MD  ASSISTANT:  Chris Salt, MD.  ANESTHESIA:  General.  IV FLUIDS:  Per anesthesia flow sheet.  ESTIMATED BLOOD LOSS:  Minimal.  COMPLICATIONS:  None.  SPECIMENS:  Cultures to micro.  TOURNIQUET TIME:  17 minutes.  DISPOSITION:  Stable to PACU.  INDICATIONS:  Chris Odom is a 57 year old male who lacerated his left thumb while skinning in [place] approximately 6 weeks ago.  Formed an infection from this.  Two previous irrigation and debridements of the thumb have been performed.  Yesterday, he presented at the office with increased pain, erythema, and fluctuance of a thumb again.  I discussed with Chris Odom the nature of the condition.  Recommended to return to the operating room for repeat irrigation and debridement.  Risks, benefits, alternatives of the surgery were discussed including risk of blood loss, infection; damage to nerves, vessels, tendons, ligaments, bone; failure of surgery; need for additional surgery; complications with wound healing; continued pain; continued infection; need for repeat irrigation and debridement; articular loss; and potential need for fusion.  He voiced understanding of these risks and elected to proceed.  OPERATIVE COURSE:  After being identified preoperatively by  myself, the patient and I agreed upon procedure and site procedure.  Surgical site was marked.  The risks, benefits, and alternatives of surgery were reviewed and wished to proceed.  Surgical consent had been signed. Antibiotics were held for cultures.  He was transferred to the operating room and placed on the operating room table in supine position with the left upper extremity on arm board.  General anesthesia was induced by anesthesiologist.  Left upper extremity was prepped and draped in normal sterile orthopedic fashion.  Surgical pause was performed between surgeons, anesthesia, operating staff, and all were in agreement as to the patient, procedure, and site of procedure.  Tourniquet at the proximal aspect of the extremity was inflated to 250 mmHg after gravity exsanguination of the hand with Esmarch and exsanguination of the forearm.  The MP joint was squeezed.  There was gross purulence expressed from the wound at the radial side of the thumb as well as from the wound at the thenar eminence.  The fluctuant area on the dorsal ulnar side of the thumb was incised sharply with a knife.  There was gross purulence within this area.  Cultures were taken for aerobes, anaerobes, AFB, and fungus.  The MP joint was entered.  There was purulence within the joint.  The articular cartilage of the metacarpal head and proximal phalanx base were very soft.  It had started to fragment.  Some of this was removed.  The MP joint was irrigated with sterile saline by Angiocath needle.  The wounds were then all copiously irrigated with sterile saline.  All wounds were packed open including the MP joint.  The 8 mL of 0.25% plain Marcaine was injected to aid in postoperative analgesia.  The wounds were then dressed with sterile 4x4s and wrapped with a Kerlix bandage.  A thumb spica splint was placed and wrapped with Kerlix and Ace bandage.  The tourniquet was deflated at 17 minutes.  Fingertips were pink  with brisk capillary refill after deflation of tourniquet.  The operative drapes were broken down.  The patient was awakened from anesthesia safely.  He was transferred back to stretcher and taken to PACU in stable condition.  I will see him back in the office in 3 days for postoperative followup.  I will give him Percocet 10/325, 1-2 p.o. q.6 hours p.r.n. pain, dispensed #40.  He was given IV vancomycin after cultures had been taken.  We were going to arrange for him to receive daily Cubicin.  He is going to follow up with Infectious Disease hopefully this week and hopefully to discuss with them the potential of a PICC line for long-term antibiotics for treatment of his recurrent infection and osteomyelitis.     Betha Loa, MD     KK/MEDQ  D:  01/05/2013  T:  01/06/2013  Job:  [id number]

## 2013-01-06 NOTE — Telephone Encounter (Signed)
Per Dr Drue Second order set the patient up an appt to have a PICC placed 01/07/13 at 12 pm at [facility] IR radiology. Called the patient and advised him of this appt and where to go and be there at 1130 am. Also sent the patients information to [facility] to deliver and train on PICC medication administration. Also faxed the order to Short Stay admission so they will have it when the patient arrives. Advised the patient to call the office if he has any questions or concerns.

## 2013-01-07 ENCOUNTER — Ambulatory Visit (HOSPITAL_COMMUNITY)
Admit: 2013-01-07 | Discharge: 2013-01-07 | Disposition: A | Discharge status: Home / Self Care | Payer: BC Managed Care – PPO | Source: Ambulatory Visit | Attending: Internal Medicine | Admitting: Internal Medicine

## 2013-01-07 ENCOUNTER — Encounter (HOSPITAL_BASED_OUTPATIENT_CLINIC_OR_DEPARTMENT_OTHER): Payer: Self-pay | Admitting: Orthopedic Surgery

## 2013-01-07 ENCOUNTER — Other Ambulatory Visit: Payer: Self-pay | Admitting: Internal Medicine

## 2013-01-07 ENCOUNTER — Emergency Department: Payer: Self-pay | Admitting: Emergency Medicine

## 2013-01-07 DIAGNOSIS — M869 Osteomyelitis, unspecified: Secondary | ICD-10-CM

## 2013-01-07 DIAGNOSIS — L089 Local infection of the skin and subcutaneous tissue, unspecified: Secondary | ICD-10-CM | POA: Insufficient documentation

## 2013-01-07 LAB — BASIC METABOLIC PANEL WITH GFR
Anion Gap: 4 — ABNORMAL LOW
BUN: 14 mg/dL
Calcium, Total: 8.7 mg/dL
Chloride: 105 mmol/L
Co2: 29 mmol/L
Creatinine: 1.11 mg/dL
EGFR (African American): >60
EGFR (Non-African Amer.): >60
Glucose: 109 mg/dL — ABNORMAL HIGH
Osmolality: 277
Potassium: 3.8 mmol/L
Sodium: 138 mmol/L

## 2013-01-07 LAB — CBC
HCT: 36.2 % — ABNORMAL LOW
HGB: 11.9 g/dL — ABNORMAL LOW
MCH: 28.5 pg
MCHC: 32.8 g/dL
MCV: 87 fL
Platelet: 298 x10 3/mm 3
RBC: 4.16 x10 6/mm 3 — ABNORMAL LOW
RDW: 13.3 %
WBC: 5.1 x10 3/mm 3

## 2013-01-07 MED ORDER — HEPARIN SOD (PORK) LOCK FLUSH 100 UNIT/ML IV SOLN
500.0000 [IU] | Freq: Once | INTRAVENOUS | Status: DC
  Filled 2013-01-07 (×2): qty 5

## 2013-01-07 MED ORDER — VANCOMYCIN HCL 10 G IV SOLR
1250.0000 mg | Freq: Once | INTRAVENOUS | Status: AC
  Administered 2013-01-07: 1250 mg via INTRAVENOUS
  Filled 2013-01-07: qty 1250

## 2013-01-07 NOTE — Progress Notes (Deleted)
Arrived in Short Stay for infusion of Vancomycin 1250 mg.

## 2013-01-07 NOTE — Procedures (Signed)
RUE PICC 40 cm.

## 2013-01-08 ENCOUNTER — Telehealth: Payer: Self-pay | Admitting: Infectious Diseases

## 2013-01-08 ENCOUNTER — Telehealth (HOSPITAL_COMMUNITY): Payer: Self-pay | Admitting: Radiology

## 2013-01-08 ENCOUNTER — Telehealth: Payer: Self-pay | Admitting: *Deleted

## 2013-01-08 NOTE — Telephone Encounter (Signed)
Left patient a message asking if he had been able to get a chest xray yet today.  RN also notified the pharmacy line at [facility].  They will continue to follow up with him over the weekend.  Dr. Drue Second aware. Andree Coss, RN

## 2013-01-08 NOTE — Telephone Encounter (Signed)
Pt called on 12-18 complaining of heart fluttering since he had PIC placed. I asked him to go to ED to assure that he had this properly placed, for CXR.

## 2013-01-09 LAB — CULTURE, ROUTINE-ABSCESS

## 2013-01-10 LAB — ANAEROBIC CULTURE

## 2013-01-11 ENCOUNTER — Ambulatory Visit (INDEPENDENT_AMBULATORY_CARE_PROVIDER_SITE_OTHER): Payer: BC Managed Care – PPO | Admitting: Internal Medicine

## 2013-01-11 ENCOUNTER — Encounter: Payer: Self-pay | Admitting: Internal Medicine

## 2013-01-11 VITALS — BP 158/80 | HR 72 | Temp 98.4°F | Ht 72.0 in | Wt 211.0 lb

## 2013-01-11 DIAGNOSIS — M869 Osteomyelitis, unspecified: Secondary | ICD-10-CM

## 2013-01-11 NOTE — Progress Notes (Signed)
   Subjective:    Patient ID: Chris Odom, male    DOB: 01-Feb-1955, 57 y.o.   MRN: [id number]  HPI 57yo M initial injury of left thumb while hunting, but also may have had pond water exposure to the wound. He has had multiple debridements by Dr. Merlyn Lot with slow recovery. He was initially placed on oral antibiotics before seeing Chris Odom for the initial debridement, with all OR cultures being negative. Chris Odom has a repeat I x D at hte end of last week which was reported to be more consistent with osteomyelitis of left hand. After discussion with Dr. Merlyn Lot, we have decided to initiate Iv therapy since slow response to oral antibiotics and requiring multiple I x D. Since the last surgery, the patient reports  feeling better with less erythema to thenar compartment, slightly decrease in swelling, and somewhat increase range of motion in 1st MCP. No fever or chills. No difficulty with picc line thus far. He was started on vancomycin 3 days ago.  Current Outpatient Prescriptions on File Prior to Visit  Medication Sig Dispense Refill  . Docusate Sodium (COLACE PO) Take 1 tablet by mouth daily.      Marland Kitchen oxyCODONE-acetaminophen (PERCOCET) 10-325 MG per tablet 1-2 tabs po q6 hours prn pain  40 tablet  0   No current facility-administered medications on file prior to visit.   Active Ambulatory Problems    Diagnosis Date Noted  . 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 No Resolved Ambulatory Problems   Past Medical History  Diagnosis Date  . Chronic kidney disease   . GERD (gastroesophageal reflux disease)   . COPD (chronic obstructive pulmonary disease)   . Angina       Review of Systems 12 ponit ROS is negative other than left thumb injury/osteomyelitis and associated swelling    Objective:   Physical Exam BP 158/80  Pulse 72  Temp(Src) 98.4 F (36.9 C) (Oral)  Ht 6' (1.829 m)  Wt 211 lb (95.709 kg)  BMI 28.61 kg/m2 Physical Exam   Constitutional: He is oriented to person, place, and time. He appears well-developed and well-nourished. No distress.  Neurological: He is alert and oriented to person, place, and time.  EXT: LEFT THUMB IS WRAPPED, right arm PICC line is C/d/i Skin: Skin is warm and dry. No rash noted. No erythema.  Psychiatric: He has a normal mood and affect. His behavior is normal.          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 Tolerating vancomycin thus far. Will start ceftriaxone today. He did have fluttering due to picc line that has been addressed.   rtc in 4 wks can overbook.  Cc: Merlyn Lot

## 2013-01-15 ENCOUNTER — Other Ambulatory Visit: Payer: Self-pay

## 2013-01-15 LAB — BASIC METABOLIC PANEL
Anion Gap: 7 (ref 7–16)
Chloride: 106 mmol/L (ref 98–107)
Co2: 28 mmol/L (ref 21–32)
Osmolality: 283 (ref 275–301)
Sodium: 141 mmol/L (ref 136–145)

## 2013-01-15 LAB — VANCOMYCIN, TROUGH: Vancomycin, Trough: 11 ug/mL (ref 10–20)

## 2013-01-16 ENCOUNTER — Emergency Department (HOSPITAL_COMMUNITY)
Admission: EM | Admit: 2013-01-16 | Discharge: 2013-01-17 | Disposition: A | Discharge status: Home / Self Care | Payer: BC Managed Care – PPO | Attending: Emergency Medicine | Admitting: Emergency Medicine

## 2013-01-16 ENCOUNTER — Encounter (HOSPITAL_COMMUNITY): Payer: Self-pay | Admitting: Emergency Medicine

## 2013-01-16 DIAGNOSIS — N189 Chronic kidney disease, unspecified: Secondary | ICD-10-CM | POA: Insufficient documentation

## 2013-01-16 DIAGNOSIS — L03019 Cellulitis of unspecified finger: Secondary | ICD-10-CM | POA: Insufficient documentation

## 2013-01-16 DIAGNOSIS — J4489 Other specified chronic obstructive pulmonary disease: Secondary | ICD-10-CM | POA: Insufficient documentation

## 2013-01-16 DIAGNOSIS — J449 Chronic obstructive pulmonary disease, unspecified: Secondary | ICD-10-CM | POA: Insufficient documentation

## 2013-01-16 DIAGNOSIS — Z87891 Personal history of nicotine dependence: Secondary | ICD-10-CM | POA: Insufficient documentation

## 2013-01-16 DIAGNOSIS — L02519 Cutaneous abscess of unspecified hand: Secondary | ICD-10-CM | POA: Insufficient documentation

## 2013-01-16 DIAGNOSIS — L0291 Cutaneous abscess, unspecified: Secondary | ICD-10-CM

## 2013-01-16 DIAGNOSIS — Z79899 Other long term (current) drug therapy: Secondary | ICD-10-CM | POA: Insufficient documentation

## 2013-01-16 DIAGNOSIS — Z8679 Personal history of other diseases of the circulatory system: Secondary | ICD-10-CM | POA: Insufficient documentation

## 2013-01-16 DIAGNOSIS — K219 Gastro-esophageal reflux disease without esophagitis: Secondary | ICD-10-CM | POA: Insufficient documentation

## 2013-01-16 MED ORDER — OXYCODONE-ACETAMINOPHEN 5-325 MG PO TABS
2.0000 | ORAL_TABLET | Freq: Once | ORAL | Status: AC
  Administered 2013-01-16: 2 via ORAL
  Filled 2013-01-16: qty 2

## 2013-01-16 MED ORDER — OXYCODONE-ACETAMINOPHEN 10-325 MG PO TABS: 1.0000 | ORAL_TABLET | Freq: Four times a day (QID) | ORAL | Status: DC | PRN

## 2013-01-16 MED ORDER — VANCOMYCIN HCL 10 G IV SOLR
1250.0000 mg | Freq: Once | INTRAVENOUS | Status: AC
  Administered 2013-01-17: 1250 mg via INTRAVENOUS
  Filled 2013-01-16: qty 1250

## 2013-01-16 NOTE — Consult Note (Signed)
  Subjective: Current patient of mine s/p I&D left thumb x 3.  Called today with concern of color change and pain in thumb.  Presents to ED for evaluation.  No fevers, chills, night sweats.  Does not feel ill.  Recently switched from percocet 10 to percocet 5.  Per wife he has been using hand a lot. Patient reports pain as moderate.    Objective: Vital signs in last 24 hours: Temp:  [97.7 F (36.5 C)] 97.7 F (36.5 C) (12/27 2148) Pulse Rate:  [81] 81 (12/27 2148) Resp:  [18] 18 (12/27 2148) BP: (138)/(78) 138/78 mmHg (12/27 2148) SpO2:  [99 %] 99 % (12/27 2148) Weight:  [215 lb (97.523 kg)] 215 lb (97.523 kg) (12/27 2148)  No results found for this basename: HGB,  in the last 72 hours No results found for this basename: WBC, RBC, HCT, PLT,  in the last 72 hours No results found for this basename: NA, K, CL, CO2, BUN, CREATININE, GLUCOSE, CALCIUM,  in the last 72 hours No results found for this basename: LABPT, INR,  in the last 72 hours  intact sensation and capillary refill all digits.  +epl/fpl/io.  wounds without purulence.  no proximal streaking.  no lymphangitis.  erythema dorsum of thumb in area of previous I&D's. no fluctuance.  no volar tenderness.  swelling in surgical area decreased.  Assessment/Plan: S/p I&D left thumb x 3.  No signs of abscess formation.  Discussed options with patient and wife.  Admission with iv abx, mri of hand, or scheduled dose of vancomycin for tonight with follow up on Monday as planned.  I do not see a necessity of admission and the patient does not desire to stay in hospital.  Will check wbc and give his evening dose of vancomycin and plan follow up in two days in office.  Per patient, he had a normal wbc drawn yesterday by home health.  May consider evaluation at tertiary medical center if infection does not clear with current care.  Will give prescription for percocet 10/325 dispense #30.  He will do ROM of hand and keep elevated, but will not over use  hand.  Patient and wife agree with plan of care.  Tobie Hellen R 01/16/2013, 11:33 PM

## 2013-01-16 NOTE — ED Notes (Signed)
Dr Kuzma at bedside. 

## 2013-01-16 NOTE — ED Notes (Signed)
The pt has had surgery on his lt hand x3 and the last surgery was one week ago.  Since yesterday he has had more pain and his lt thumb is more painful and swollen  Dr Merlyn Lot is to see

## 2013-01-16 NOTE — ED Notes (Signed)
The pt has a picc line in his rt arm for the Zenaida Niece he gets 2 times a day

## 2013-01-16 NOTE — ED Notes (Signed)
Pt now asking for pain med.  Dr. Merlyn Lot contacted.

## 2013-01-16 NOTE — ED Provider Notes (Signed)
CSN: [id number]     Arrival date & time 01/16/13  2117 History   First MD Initiated Contact with Patient 01/16/13 2303     Chief Complaint  Patient presents with  . Hand Pain   (Consider location/radiation/quality/duration/timing/severity/associated sxs/prior Treatment) HPI Hx per PT  - L thumb pain and inc swelling today, followed by hand Dr Merlyn Lot for thumb infection requiring I/D x 3. Last surgery a week ago.  Is on home ABx thru RUE PICC. No F/C. No arm swelling. Taking percocet with persistent pain. Pain is sharp and mod in severity    Past Medical History  Diagnosis Date  . Chronic kidney disease     hx stones  . GERD (gastroesophageal reflux disease)   . COPD (chronic obstructive pulmonary disease)     a little -due to smoking- no problems recently  . Angina    Past Surgical History  Procedure Laterality Date  . Foot neuroma surgery  10    rt  . Hand surgery  98    rt hand gangion, wrist ganglion  . Knee arthroscopy  05    ganglion cyst  . Kidney stone surgery      cysto  . Cervical fusion  01/2011    C 3 -C7  . Colonoscopy  2009    internal hemorrhoids  . Excisional hemorrhoidectomy  2014  . Incision and drainage abscess Left 12/04/2012    Procedure: INCISION AND DRAINAGE ABSCESS;  Surgeon: Tami Ribas, MD;  Location: [facility];  Service: Orthopedics;  Laterality: Left;  i and d np joint abcess  left thumb   . I&d extremity Left 12/22/2012    Procedure: IRRIGATION AND DEBRIDEMENT LEFT THUMB/HAND ;  Surgeon: Tami Ribas, MD;  Location: [facility];  Service: Orthopedics;  Laterality: Left;  . Incision and drainage abscess Left 01/05/2013    Procedure: INCISION AND DRAINAGE LEFT THUMB;  Surgeon: Tami Ribas, MD;  Location: [facility];  Service: Orthopedics;  Laterality: Left;   Family History  Problem Relation Age of Onset  . Hypertension Mother   . Diabetes Father    History  Substance Use Topics  . Smoking  status: Former Smoker -- 2.50 packs/day for 30 years    Quit date: 06/19/2005  . Smokeless tobacco: Never Used  . Alcohol Use: No    Review of Systems  Constitutional: Negative for fever and chills.  Respiratory: Negative for shortness of breath.   Cardiovascular: Negative for chest pain.  Gastrointestinal: Negative for abdominal pain.  Musculoskeletal: Negative for back pain, neck pain and neck stiffness.  Skin: Positive for wound.  Neurological: Negative for weakness and numbness.  All other systems reviewed and are negative.    Allergies  Review of patient's allergies indicates no known allergies.  Home Medications   Current Outpatient Rx  Name  Route  Sig  Dispense  Refill  . Docusate Sodium (COLACE PO)   Oral   Take 1 tablet by mouth daily.         Marland Kitchen ibuprofen (ADVIL,MOTRIN) 200 MG tablet   Oral   Take 800 mg by mouth every 4 (four) hours as needed.         Marland Kitchen LORazepam (ATIVAN) 0.5 MG tablet   Oral   Take 0.5 mg by mouth every 6 (six) hours as needed for anxiety.         Marland Kitchen oxyCODONE-acetaminophen (PERCOCET) 10-325 MG per tablet      1-2 tabs  po q6 hours prn pain   40 tablet   0   . polyethylene glycol (MIRALAX / GLYCOLAX) packet   Oral   Take 17 g by mouth daily.         . sodium chloride 0.9 % SOLN 250 mL with vancomycin 10 G SOLR   Intravenous   Inject 1,250 mg into the vein every 12 (twelve) hours.          BP 138/78  Pulse 81  Temp(Src) 97.7 F (36.5 C)  Resp 18  Ht 6' (1.829 m)  Wt 215 lb (97.523 kg)  BMI 29.15 kg/m2  SpO2 99% Physical Exam  Constitutional: He is oriented to person, place, and time. He appears well-developed and well-nourished.  HENT:  Head: Normocephalic and atraumatic.  Eyes: EOM are normal. Pupils are equal, round, and reactive to light.  Neck: Neck supple.  Cardiovascular: Normal rate, regular rhythm and intact distal pulses.   Pulmonary/Chest: Effort normal. No respiratory distress.  Musculoskeletal:  L  thumb with tenderness, swelling, no streaking erythema, no active drainage. Distal sensorium and cap refill intact  Neurological: He is alert and oriented to person, place, and time.  Skin: Skin is warm and dry.    ED Course  Procedures (including critical care time) Labs Review Labs Reviewed  CBC WITH DIFFERENTIAL - Abnormal; Notable for the following:    RBC 4.20 (*)    Hemoglobin 12.7 (*)    HCT 37.0 (*)    All other components within normal limits   Imaging Review No results found.  EKG Interpretation   None        11:12 PM Dr Merlyn Lot bedisde  Percocet PO. IV ABx  1:14 AM still having a lot of pain. Iv Dilaudid provided. D/w Dr Merlyn Lot, WBC WNL, plan d/c home and f/u Monday in office as scheduled.   MDM  Dx: L thumb infection  Labs, IV ABx, IV narcotics Hand evaluated bedside VS and nurse notes reviewed and considered    Sunnie Nielsen, MD 01/17/13 [phone]

## 2013-01-17 LAB — CBC WITH DIFFERENTIAL/PLATELET
Basophils Absolute: 0 10*3/uL (ref 0.0–0.1)
Basophils Relative: 0 % (ref 0–1)
Eosinophils Absolute: 0.2 10*3/uL (ref 0.0–0.7)
Eosinophils Relative: 3 % (ref 0–5)
HCT: 37 % — ABNORMAL LOW (ref 39.0–52.0)
Lymphocytes Relative: 32 % (ref 12–46)
MCHC: 34.3 g/dL (ref 30.0–36.0)
MCV: 88.1 fL (ref 78.0–100.0)
Monocytes Absolute: 0.7 10*3/uL (ref 0.1–1.0)
Neutro Abs: 3.8 10*3/uL (ref 1.7–7.7)
Neutrophils Relative %: 56 % (ref 43–77)
Platelets: 242 10*3/uL (ref 150–400)
RDW: 13.2 % (ref 11.5–15.5)
WBC: 6.8 10*3/uL (ref 4.0–10.5)

## 2013-01-17 MED ORDER — HYDROMORPHONE HCL PF 1 MG/ML IJ SOLN
1.0000 mg | Freq: Once | INTRAMUSCULAR | Status: AC
  Administered 2013-01-17: 1 mg via INTRAVENOUS
  Filled 2013-01-17: qty 1

## 2013-01-17 MED ORDER — ONDANSETRON HCL 4 MG/2ML IJ SOLN
4.0000 mg | Freq: Once | INTRAMUSCULAR | Status: AC
  Administered 2013-01-17: 4 mg via INTRAVENOUS
  Filled 2013-01-17: qty 2

## 2013-01-17 NOTE — ED Notes (Signed)
In to check on pt. Pt sts that antibiotic didn't seem to be infusing in fast enough. Pump was checked and seemed to be working well.

## 2013-01-17 NOTE — ED Notes (Signed)
Pt dc to home. Pt sts understanding to dc instructions. Pt ambulatory to exit without difficulty. Pt denies need for w/c. Pt sts that he will f/u with pcp on Monday at appt.

## 2013-01-19 LAB — FUNGUS CULTURE W SMEAR: Fungal Smear: NONE SEEN

## 2013-01-21 HISTORY — PX: THUMB ARTHROSCOPY: SHX2509

## 2013-02-02 ENCOUNTER — Telehealth: Payer: Self-pay | Admitting: *Deleted

## 2013-02-02 LAB — FUNGUS CULTURE W SMEAR: Fungal Smear: NONE SEEN

## 2013-02-02 NOTE — Telephone Encounter (Signed)
Chris Odom with [facility] called to get a verbal to pull the patient PICC. She advised that the patient was referred to wound care at [facility] and they stopped his IV antibiotics 01/27/13 to do cultures and have not restarted them. He saw wound care today, 02/02/13 and his PICC is clogged it will not push and can not draw from it even after Cathflow. She advised that wound care will not give an order to pull the PICC and in her opinion it is so clogged the patient will need a new PICC even if they decide to continue him on IV antibiotics. She wants an order from Dr Baxter Flattery to pull the PICC. Advised her will have to contact the doctor and let her know what is going on and see what she recommends and give her a call back with an answer. She advised to call her back on [phone] asap.

## 2013-02-03 ENCOUNTER — Other Ambulatory Visit: Payer: Self-pay | Admitting: *Deleted

## 2013-02-03 DIAGNOSIS — M869 Osteomyelitis, unspecified: Secondary | ICD-10-CM

## 2013-02-03 DIAGNOSIS — M86149 Other acute osteomyelitis, unspecified hand: Secondary | ICD-10-CM | POA: Insufficient documentation

## 2013-02-03 LAB — AFB CULTURE WITH SMEAR (NOT AT ARMC): Acid Fast Smear: NONE SEEN

## 2013-02-03 MED ORDER — DOXYCYCLINE HYCLATE 100 MG PO CAPS: 100.0000 mg | ORAL_CAPSULE | Freq: Two times a day (BID) | ORAL | Status: DC

## 2013-02-03 NOTE — Telephone Encounter (Signed)
i spoke to advance to pull picc line since it appears it has been troublesome for the patient from the get-go. He has been on Iv antibiotics roughly 4 wks. Home health called to see if picc line can be removed. i gave order for its removal. It appears patient was at [facility] wound care where he got another debridement and cultures sent. Spoke with jackie to get results from [facility] to see where to go next to see if need to do further iv antibiotics.   In meantime, could you call in rx for doxycycline 100mg  bid x 4 wk for the patient. He is to take after a meal to minimize nausea. If he gets nauseated, give [place] a call so that we can give him phenergan.

## 2013-02-05 ENCOUNTER — Ambulatory Visit: Payer: Self-pay | Admitting: Internal Medicine

## 2013-02-05 ENCOUNTER — Encounter (HOSPITAL_BASED_OUTPATIENT_CLINIC_OR_DEPARTMENT_OTHER): Payer: BC Managed Care – PPO | Attending: General Surgery

## 2013-02-05 DIAGNOSIS — L03019 Cellulitis of unspecified finger: Secondary | ICD-10-CM | POA: Insufficient documentation

## 2013-02-05 DIAGNOSIS — L02519 Cutaneous abscess of unspecified hand: Secondary | ICD-10-CM | POA: Insufficient documentation

## 2013-02-05 DIAGNOSIS — M869 Osteomyelitis, unspecified: Secondary | ICD-10-CM | POA: Insufficient documentation
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
## 2013-02-07 ENCOUNTER — Other Ambulatory Visit: Payer: Self-pay

## 2013-02-07 LAB — CREATININE, SERUM
Creatinine: 0.79 mg/dL (ref 0.60–1.30)
EGFR (African American): >60

## 2013-02-07 LAB — VANCOMYCIN, TROUGH: Vancomycin, Trough: 8 ug/mL — ABNORMAL LOW (ref 10–20)

## 2013-02-07 LAB — BUN: BUN: 11 mg/dL (ref 7–18)

## 2013-02-08 ENCOUNTER — Telehealth: Payer: Self-pay | Admitting: *Deleted

## 2013-02-08 NOTE — Telephone Encounter (Signed)
Pt had a question about the doxycycline rx at his pharmacy.  RN reviewed EPIC notes from last week.  Pt's PICC pulled last week due to difficulties.  Pt was to have started the doxycyline once the PICC was pulled.  Pt verbalized understanding and will pick up the doxycyline from his pharmacy today to start it.  Pt was advised that the doxycyline should be taken for the entire 4 weeks.  Verbalized understanding.

## 2013-02-11 ENCOUNTER — Ambulatory Visit: Payer: BC Managed Care – PPO | Admitting: Internal Medicine

## 2013-02-16 ENCOUNTER — Telehealth: Payer: Self-pay | Admitting: *Deleted

## 2013-02-16 NOTE — Progress Notes (Signed)
[facility]  NAME:  OWEN, PRATTE NO.:  [id number]  MEDICAL RECORD NO.:  [id number]      DATE OF BIRTH:  05/03/1955  PHYSICIAN:  Judene Companion, M.D.           VISIT DATE:                                  OFFICE VISIT   I am writing a note to [facility] regarding their decision on Mr. Micco Bourbeau to decline for him to have hyperbaric oxygen treatments for his treatment of osteomyelitis of his left thumb. Apparently, they feel that this is not chronic enough to warrant hyperbaric oxygen.  When this gentleman has been treated for chronic osteomyelitis for about 4 months now and he still is having drainage and problems and needing more surgery.  The plan is to do some more debridement of the infected bone and continue IV antibiotics, and to incorporate hyperbaric oxygen in an attempt to save this gentleman's thumb.  So, I would request you to again review our request for hyperbaric oxygen, and I am in hopes that you will reconsider.     Judene Companion, M.D.     PP/MEDQ  D:  02/15/2013  T:  02/16/2013  Job:  [id number]

## 2013-02-16 NOTE — Telephone Encounter (Signed)
Called the patient to see why he cancelled his follow up appointments with Dr. Baxter Flattery.  Patient stated that he is now in care at [facility], has seen them once already and will follow up next with them 2/11.   Landis Gandy, RN

## 2013-02-17 LAB — AFB CULTURE WITH SMEAR (NOT AT ARMC): Acid Fast Smear: NONE SEEN

## 2013-02-22 ENCOUNTER — Encounter (HOSPITAL_BASED_OUTPATIENT_CLINIC_OR_DEPARTMENT_OTHER): Payer: BC Managed Care – PPO | Attending: General Surgery

## 2013-02-22 DIAGNOSIS — M869 Osteomyelitis, unspecified: Secondary | ICD-10-CM | POA: Insufficient documentation

## 2013-03-22 ENCOUNTER — Encounter (HOSPITAL_BASED_OUTPATIENT_CLINIC_OR_DEPARTMENT_OTHER): Payer: BC Managed Care – PPO | Attending: General Surgery

## 2013-03-22 DIAGNOSIS — S91009A Unspecified open wound, unspecified ankle, initial encounter: Principal | ICD-10-CM

## 2013-03-22 DIAGNOSIS — S81009A Unspecified open wound, unspecified knee, initial encounter: Secondary | ICD-10-CM | POA: Insufficient documentation

## 2013-03-22 DIAGNOSIS — S81809A Unspecified open wound, unspecified lower leg, initial encounter: Principal | ICD-10-CM

## 2013-03-22 DIAGNOSIS — L03119 Cellulitis of unspecified part of limb: Secondary | ICD-10-CM

## 2013-03-22 DIAGNOSIS — W540XXA Bitten by dog, initial encounter: Secondary | ICD-10-CM | POA: Insufficient documentation

## 2013-03-22 DIAGNOSIS — L02419 Cutaneous abscess of limb, unspecified: Secondary | ICD-10-CM | POA: Insufficient documentation

## 2013-04-12 DIAGNOSIS — L02512 Cutaneous abscess of left hand: Secondary | ICD-10-CM | POA: Insufficient documentation

## 2013-04-19 ENCOUNTER — Telehealth: Payer: Self-pay | Admitting: *Deleted

## 2013-04-19 NOTE — Telephone Encounter (Signed)
Received fax stating that the patient has been D/C'd from [facility] home health services (01/08/13 - 04/02/13) as his goals were met. Landis Gandy, RN

## 2013-04-21 ENCOUNTER — Encounter (HOSPITAL_BASED_OUTPATIENT_CLINIC_OR_DEPARTMENT_OTHER): Payer: BC Managed Care – PPO | Attending: General Surgery

## 2013-04-21 DIAGNOSIS — L03019 Cellulitis of unspecified finger: Secondary | ICD-10-CM | POA: Insufficient documentation

## 2013-04-21 DIAGNOSIS — L02519 Cutaneous abscess of unspecified hand: Secondary | ICD-10-CM | POA: Insufficient documentation

## 2013-04-21 DIAGNOSIS — M869 Osteomyelitis, unspecified: Secondary | ICD-10-CM | POA: Insufficient documentation

## 2013-04-22 DIAGNOSIS — M869 Osteomyelitis, unspecified: Secondary | ICD-10-CM | POA: Diagnosis present

## 2013-04-22 DIAGNOSIS — L02519 Cutaneous abscess of unspecified hand: Secondary | ICD-10-CM | POA: Diagnosis not present

## 2013-04-22 DIAGNOSIS — L03019 Cellulitis of unspecified finger: Secondary | ICD-10-CM | POA: Diagnosis not present
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
## 2013-04-23 DIAGNOSIS — M869 Osteomyelitis, unspecified: Secondary | ICD-10-CM | POA: Diagnosis not present

## 2013-04-26 DIAGNOSIS — M869 Osteomyelitis, unspecified: Secondary | ICD-10-CM | POA: Diagnosis not present

## 2013-04-27 DIAGNOSIS — M869 Osteomyelitis, unspecified: Secondary | ICD-10-CM | POA: Diagnosis not present

## 2013-04-28 DIAGNOSIS — M869 Osteomyelitis, unspecified: Secondary | ICD-10-CM | POA: Diagnosis not present

## 2013-04-29 DIAGNOSIS — M869 Osteomyelitis, unspecified: Secondary | ICD-10-CM | POA: Diagnosis not present

## 2013-05-03 DIAGNOSIS — M869 Osteomyelitis, unspecified: Secondary | ICD-10-CM | POA: Diagnosis not present

## 2013-05-04 DIAGNOSIS — M869 Osteomyelitis, unspecified: Secondary | ICD-10-CM | POA: Diagnosis not present

## 2013-05-05 DIAGNOSIS — M869 Osteomyelitis, unspecified: Secondary | ICD-10-CM | POA: Diagnosis not present

## 2013-05-06 DIAGNOSIS — M869 Osteomyelitis, unspecified: Secondary | ICD-10-CM | POA: Diagnosis not present

## 2013-05-10 DIAGNOSIS — M869 Osteomyelitis, unspecified: Secondary | ICD-10-CM | POA: Diagnosis not present

## 2013-05-11 DIAGNOSIS — M869 Osteomyelitis, unspecified: Secondary | ICD-10-CM | POA: Diagnosis not present

## 2013-05-12 DIAGNOSIS — M869 Osteomyelitis, unspecified: Secondary | ICD-10-CM | POA: Diagnosis not present

## 2013-05-13 DIAGNOSIS — M869 Osteomyelitis, unspecified: Secondary | ICD-10-CM | POA: Diagnosis not present

## 2013-06-01 ENCOUNTER — Ambulatory Visit: Payer: Self-pay | Admitting: Family Medicine

## 2013-06-25 ENCOUNTER — Ambulatory Visit: Payer: Self-pay | Admitting: Family Medicine

## 2013-11-19 NOTE — H&P (Signed)
  [facility] [address] [place],  [id number] [phone] A Division of [facility]  Ninetta Lights, M.D.   Robert A. Noemi Chapel, M.D.   Faythe Casa, M.D.   Johnny Bridge, M.D.   Almedia Balls, M.D. Ernesta Amble. Percell Miller, M.D.  Joseph Pierini, M.D.  Lanier Prude, M.D.    Verner Chol, M.D. Mary L. Fenton Malling, PA-C  Kirstin A. Shepperson, PA-C  Josh Hunker, PA-C [place], [place]   RE: Chris Odom, Chris Odom   [id number]      DOB: 1955-05-06 PROGRESS NOTE: 11-17-13  History:  He is here for follow-up examination of his bilateral shoulders.  He has had ongoing right and left shoulder pain for several months.  He has pretty significant right AC joint arthritis and some possible intraarticular pathology of the right shoulder.  His left shoulder appears to be more scapular in nature and associated with the rhomboids and trapezius muscle.  He gets sharp pain in his left shoulder but any continued use of his right shoulder causes excruciating pain.   He has had an AC joint injection and what sounded like a subacromial injection into his right shoulder but has not had any injections for the left.   Please see associated documentation for this clinic visit for further past medical, family, surgical and social history, review of systems, and exam findings as this was reviewed by me.  EXAMINATION: On exam, he has full active range of motion in both shoulders but has pain with any motion over 90 degrees in his right shoulder, intermittent pain with position and activity in the left shoulder.  Full range of motion.  Full elbow, wrist and hand function.     IMPRESSION: 1. 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 2. Left shoulder pain.   PLAN: We are going to plan for MRI of his right shoulder and will go ahead and schedule him for surgery.  He is very symptomatic at his [facility] joint and with his impingement.  Will  plan for distal clavicle excision, limited debridement and subacromial decompression.  If the MRI demonstrates further pathology, we will let him known and will plan for that accordingly with surgery.  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  Will plan to see him back in the office after surgery.  We will plan also to call him after his MRI results are in to let him know if there are any changes to the surgical plan.  When we start physical therapy after surgery, we will plan for him to also have physical therapy for scapular stabilization and range of motion and strengthening of his left shoulder.    Ernesta Amble.  Percell Miller, M.D. Dictated by Shanon Brow Leslee Home, OPA-C Electronically verified by Ernesta Amble Percell Miller, M.D. TDM(BDP):gde D 11-17-13 T 11-18-13

## 2013-11-23 ENCOUNTER — Encounter (HOSPITAL_BASED_OUTPATIENT_CLINIC_OR_DEPARTMENT_OTHER): Payer: Self-pay | Admitting: *Deleted

## 2013-11-23 NOTE — Progress Notes (Signed)
Pt did not need labs-he had 3 hand surgeries here last yr

## 2013-11-26 ENCOUNTER — Ambulatory Visit (HOSPITAL_BASED_OUTPATIENT_CLINIC_OR_DEPARTMENT_OTHER)
Admission: RE | Admit: 2013-11-26 | Discharge: 2013-11-26 | Disposition: A | Discharge status: Home / Self Care | Payer: BC Managed Care – PPO | Source: Ambulatory Visit | Attending: Orthopedic Surgery | Admitting: Orthopedic Surgery

## 2013-11-26 ENCOUNTER — Ambulatory Visit (HOSPITAL_BASED_OUTPATIENT_CLINIC_OR_DEPARTMENT_OTHER): Payer: BC Managed Care – PPO | Admitting: Certified Registered"

## 2013-11-26 ENCOUNTER — Encounter (HOSPITAL_BASED_OUTPATIENT_CLINIC_OR_DEPARTMENT_OTHER)
Admission: RE | Disposition: A | Discharge status: Home / Self Care | Payer: Self-pay | Source: Ambulatory Visit | Attending: Orthopedic Surgery

## 2013-11-26 ENCOUNTER — Encounter (HOSPITAL_BASED_OUTPATIENT_CLINIC_OR_DEPARTMENT_OTHER): Payer: Self-pay | Admitting: Certified Registered"

## 2013-11-26 DIAGNOSIS — M7551 Bursitis of right shoulder: Secondary | ICD-10-CM | POA: Diagnosis not present

## 2013-11-26 DIAGNOSIS — M19011 Primary osteoarthritis, right shoulder: Secondary | ICD-10-CM | POA: Insufficient documentation

## 2013-11-26 DIAGNOSIS — X58XXXA Exposure to other specified factors, initial encounter: Secondary | ICD-10-CM | POA: Insufficient documentation

## 2013-11-26 DIAGNOSIS — Z87891 Personal history of nicotine dependence: Secondary | ICD-10-CM | POA: Diagnosis not present

## 2013-11-26 DIAGNOSIS — J449 Chronic obstructive pulmonary disease, unspecified: Secondary | ICD-10-CM | POA: Insufficient documentation

## 2013-11-26 DIAGNOSIS — S43431A Superior glenoid labrum lesion of right shoulder, initial encounter: Secondary | ICD-10-CM | POA: Insufficient documentation

## 2013-11-26 DIAGNOSIS — K219 Gastro-esophageal reflux disease without esophagitis: Secondary | ICD-10-CM | POA: Diagnosis not present

## 2013-11-26 DIAGNOSIS — M7521 Bicipital tendinitis, right shoulder: Secondary | ICD-10-CM | POA: Insufficient documentation

## 2013-11-26 DIAGNOSIS — M25512 Pain in left shoulder: Secondary | ICD-10-CM | POA: Insufficient documentation

## 2013-11-26 HISTORY — DX: Presence of spectacles and contact lenses: Z97.3

## 2013-11-26 LAB — POCT HEMOGLOBIN-HEMACUE: Hemoglobin: 15.6 g/dL (ref 13.0–17.0)

## 2013-11-26 SURGERY — SHOULDER ARTHROSCOPY WITH SUBACROMIAL DECOMPRESSION AND DISTAL CLAVICLE EXCISION
Anesthesia: General | Site: Shoulder | Laterality: Right

## 2013-11-26 MED ORDER — ACETAMINOPHEN 500 MG PO TABS
ORAL_TABLET | ORAL | Status: AC
  Filled 2013-11-26: qty 2

## 2013-11-26 MED ORDER — BUPIVACAINE-EPINEPHRINE (PF) 0.5% -1:200000 IJ SOLN
INTRAMUSCULAR | Status: DC | PRN
  Administered 2013-11-26: 25 mL via PERINEURAL

## 2013-11-26 MED ORDER — ACETAMINOPHEN 500 MG PO TABS
1000.0000 mg | ORAL_TABLET | Freq: Once | ORAL | Status: AC
  Administered 2013-11-26: 1000 mg via ORAL

## 2013-11-26 MED ORDER — DOCUSATE SODIUM 100 MG PO CAPS: 100.0000 mg | ORAL_CAPSULE | Freq: Two times a day (BID) | ORAL | Status: DC

## 2013-11-26 MED ORDER — FENTANYL CITRATE 0.05 MG/ML IJ SOLN
INTRAMUSCULAR | Status: AC
  Filled 2013-11-26: qty 6

## 2013-11-26 MED ORDER — OXYCODONE HCL 5 MG PO TABS: 10.0000 mg | ORAL_TABLET | ORAL | Status: DC | PRN

## 2013-11-26 MED ORDER — PROMETHAZINE HCL 25 MG/ML IJ SOLN: 6.2500 mg | INTRAMUSCULAR | Status: DC | PRN

## 2013-11-26 MED ORDER — MIDAZOLAM HCL 5 MG/5ML IJ SOLN
INTRAMUSCULAR | Status: DC | PRN
  Administered 2013-11-26: 1 mg via INTRAVENOUS

## 2013-11-26 MED ORDER — ONDANSETRON HCL 4 MG PO TABS: 4.0000 mg | ORAL_TABLET | Freq: Three times a day (TID) | ORAL | Status: DC | PRN

## 2013-11-26 MED ORDER — FENTANYL CITRATE 0.05 MG/ML IJ SOLN
50.0000 ug | INTRAMUSCULAR | Status: DC | PRN
  Administered 2013-11-26: 100 ug via INTRAVENOUS

## 2013-11-26 MED ORDER — METHYLPREDNISOLONE ACETATE 80 MG/ML IJ SUSP
INTRAMUSCULAR | Status: DC | PRN
  Administered 2013-11-26: 80 mg

## 2013-11-26 MED ORDER — METHOCARBAMOL 500 MG PO TABS: 500.0000 mg | ORAL_TABLET | Freq: Four times a day (QID) | ORAL | Status: DC | PRN

## 2013-11-26 MED ORDER — MIDAZOLAM HCL 2 MG/2ML IJ SOLN
INTRAMUSCULAR | Status: AC
  Filled 2013-11-26: qty 2

## 2013-11-26 MED ORDER — OXYCODONE HCL 5 MG PO TABS: 5.0000 mg | ORAL_TABLET | Freq: Once | ORAL | Status: DC | PRN

## 2013-11-26 MED ORDER — DEXAMETHASONE SODIUM PHOSPHATE 4 MG/ML IJ SOLN
INTRAMUSCULAR | Status: DC | PRN
  Administered 2013-11-26: 10 mg via INTRAVENOUS

## 2013-11-26 MED ORDER — HYDROCODONE-ACETAMINOPHEN 5-325 MG PO TABS: 2.0000 | ORAL_TABLET | ORAL | Status: DC | PRN

## 2013-11-26 MED ORDER — DEXTROSE-NACL 5-0.45 % IV SOLN: 100.0000 mL/h | INTRAVENOUS | Status: DC

## 2013-11-26 MED ORDER — ONDANSETRON HCL 4 MG/2ML IJ SOLN
INTRAMUSCULAR | Status: DC | PRN
  Administered 2013-11-26: 4 mg via INTRAVENOUS

## 2013-11-26 MED ORDER — FENTANYL CITRATE 0.05 MG/ML IJ SOLN
INTRAMUSCULAR | Status: DC | PRN
  Administered 2013-11-26: 25 ug via INTRAVENOUS

## 2013-11-26 MED ORDER — HYDROMORPHONE HCL 1 MG/ML IJ SOLN: 0.2500 mg | INTRAMUSCULAR | Status: DC | PRN

## 2013-11-26 MED ORDER — FENTANYL CITRATE 0.05 MG/ML IJ SOLN
INTRAMUSCULAR | Status: AC
  Filled 2013-11-26: qty 2

## 2013-11-26 MED ORDER — BUPIVACAINE HCL (PF) 0.5 % IJ SOLN
INTRAMUSCULAR | Status: DC | PRN
  Administered 2013-11-26: 5 mL

## 2013-11-26 MED ORDER — METHYLPREDNISOLONE ACETATE 80 MG/ML IJ SUSP
INTRAMUSCULAR | Status: AC
  Filled 2013-11-26: qty 1

## 2013-11-26 MED ORDER — BUPIVACAINE HCL (PF) 0.5 % IJ SOLN
INTRAMUSCULAR | Status: AC
  Filled 2013-11-26: qty 30

## 2013-11-26 MED ORDER — LACTATED RINGERS IV SOLN
INTRAVENOUS | Status: DC
  Administered 2013-11-26 (×2): via INTRAVENOUS

## 2013-11-26 MED ORDER — CEFAZOLIN SODIUM-DEXTROSE 2-3 GM-% IV SOLR
INTRAVENOUS | Status: AC
  Filled 2013-11-26: qty 50

## 2013-11-26 MED ORDER — SUCCINYLCHOLINE CHLORIDE 20 MG/ML IJ SOLN
INTRAMUSCULAR | Status: DC | PRN
  Administered 2013-11-26: 100 mg via INTRAVENOUS

## 2013-11-26 MED ORDER — SODIUM CHLORIDE 0.9 % IR SOLN
Status: DC | PRN
  Administered 2013-11-26: 9000 mL

## 2013-11-26 MED ORDER — PROPOFOL 10 MG/ML IV BOLUS
INTRAVENOUS | Status: DC | PRN
  Administered 2013-11-26: 200 mg via INTRAVENOUS

## 2013-11-26 MED ORDER — CEFAZOLIN SODIUM-DEXTROSE 2-3 GM-% IV SOLR
2.0000 g | INTRAVENOUS | Status: AC
  Administered 2013-11-26: 2 g via INTRAVENOUS

## 2013-11-26 MED ORDER — OXYCODONE HCL 5 MG/5ML PO SOLN: 5.0000 mg | Freq: Once | ORAL | Status: DC | PRN

## 2013-11-26 MED ORDER — MIDAZOLAM HCL 2 MG/2ML IJ SOLN
1.0000 mg | INTRAMUSCULAR | Status: DC | PRN
Start: 2013-11-26 — End: 2013-11-26
  Administered 2013-11-26: 2 mg via INTRAVENOUS

## 2013-11-26 SURGICAL SUPPLY — 73 items
BLADE CUTTER GATOR 3.5 (BLADE) ×2 IMPLANT
BLADE CUTTER MENIS 5.5 (BLADE) IMPLANT
BLADE GREAT WHITE 4.2 (BLADE) IMPLANT
BLADE SURG 15 STRL LF DISP TIS (BLADE) IMPLANT
BLADE SURG 15 STRL SS (BLADE)
BUR OVAL 4.0 (BURR) IMPLANT
BUR OVAL 6.0 (BURR) ×2 IMPLANT
CANISTER SUCT 3000ML (MISCELLANEOUS) IMPLANT
CANNULA 5.75X71 LONG (CANNULA) IMPLANT
CANNULA TWIST IN 8.25X7CM (CANNULA) IMPLANT
CHLORAPREP W/TINT 26ML (MISCELLANEOUS) ×2 IMPLANT
CLSR STERI-STRIP ANTIMIC 1/2X4 (GAUZE/BANDAGES/DRESSINGS) IMPLANT
DRAPE SHOULDER BEACH CHAIR (DRAPES) ×2 IMPLANT
DRAPE STERI 35X30 U-POUCH (DRAPES) IMPLANT
DRAPE U 20/CS (DRAPES) ×2 IMPLANT
DRAPE U-SHAPE 47X51 STRL (DRAPES) ×2 IMPLANT
DRSG EMULSION OIL 3X3 NADH (GAUZE/BANDAGES/DRESSINGS) IMPLANT
DRSG PAD ABDOMINAL 8X10 ST (GAUZE/BANDAGES/DRESSINGS) ×2 IMPLANT
ELECT NEEDLE TIP 2.8 STRL (NEEDLE) IMPLANT
ELECT REM PT RETURN 9FT ADLT (ELECTROSURGICAL)
ELECTRODE REM PT RTRN 9FT ADLT (ELECTROSURGICAL) IMPLANT
GAUZE SPONGE 4X4 12PLY STRL (GAUZE/BANDAGES/DRESSINGS) ×2 IMPLANT
GAUZE XEROFORM 1X8 LF (GAUZE/BANDAGES/DRESSINGS) ×2 IMPLANT
GLOVE BIO SURGEON STRL SZ 6.5 (GLOVE) ×2 IMPLANT
GLOVE BIO SURGEON STRL SZ7.5 (GLOVE) ×2 IMPLANT
GLOVE BIO SURGEON STRL SZ8 (GLOVE) IMPLANT
GLOVE BIOGEL PI IND STRL 7.0 (GLOVE) ×1 IMPLANT
GLOVE BIOGEL PI IND STRL 8 (GLOVE) ×1 IMPLANT
GLOVE BIOGEL PI INDICATOR 7.0 (GLOVE) ×1
GLOVE BIOGEL PI INDICATOR 8 (GLOVE) ×1
GLOVE ECLIPSE 6.5 STRL STRAW (GLOVE) ×2 IMPLANT
GLOVE EXAM NITRILE LRG STRL (GLOVE) ×2 IMPLANT
GOWN STRL REUS W/ TWL LRG LVL3 (GOWN DISPOSABLE) ×3 IMPLANT
GOWN STRL REUS W/ TWL XL LVL3 (GOWN DISPOSABLE) IMPLANT
GOWN STRL REUS W/TWL LRG LVL3 (GOWN DISPOSABLE) ×3
GOWN STRL REUS W/TWL XL LVL3 (GOWN DISPOSABLE)
IV NS IRRIG 3000ML ARTHROMATIC (IV SOLUTION) ×6 IMPLANT
MANIFOLD NEPTUNE II (INSTRUMENTS) ×2 IMPLANT
NDL SUT 6 .5 CRC .975X.05 MAYO (NEEDLE) IMPLANT
NEEDLE MAYO TAPER (NEEDLE)
NEEDLE SCORPION MULTI FIRE (NEEDLE) IMPLANT
NS IRRIG 1000ML POUR BTL (IV SOLUTION) IMPLANT
PACK ARTHROSCOPY DSU (CUSTOM PROCEDURE TRAY) ×2 IMPLANT
PACK BASIN DAY SURGERY FS (CUSTOM PROCEDURE TRAY) ×2 IMPLANT
PENCIL BUTTON HOLSTER BLD 10FT (ELECTRODE) IMPLANT
SET ARTHROSCOPY TUBING (MISCELLANEOUS) ×1
SET ARTHROSCOPY TUBING LN (MISCELLANEOUS) ×1 IMPLANT
SLEEVE SCD COMPRESS KNEE MED (MISCELLANEOUS) ×2 IMPLANT
SLING ARM IMMOBILIZER LRG (SOFTGOODS) IMPLANT
SLING ARM IMMOBILIZER MED (SOFTGOODS) IMPLANT
SLING ARM LRG ADULT FOAM STRAP (SOFTGOODS) ×2 IMPLANT
SLING ARM MED ADULT FOAM STRAP (SOFTGOODS) IMPLANT
SLING ARM XL FOAM STRAP (SOFTGOODS) IMPLANT
SPONGE LAP 4X18 X RAY DECT (DISPOSABLE) IMPLANT
SUCTION FRAZIER TIP 10 FR DISP (SUCTIONS) IMPLANT
SUT ETHIBOND 2 OS 4 DA (SUTURE) IMPLANT
SUT ETHILON 2 0 FS 18 (SUTURE) IMPLANT
SUT ETHILON 3 0 PS 1 (SUTURE) ×4 IMPLANT
SUT FIBERWIRE #2 38 T-5 BLUE (SUTURE)
SUT MNCRL AB 4-0 PS2 18 (SUTURE) IMPLANT
SUT TIGER TAPE 7 IN WHITE (SUTURE) IMPLANT
SUT VIC AB 0 CT1 27 (SUTURE)
SUT VIC AB 0 CT1 27XBRD ANBCTR (SUTURE) IMPLANT
SUT VIC AB 2-0 SH 27 (SUTURE)
SUT VIC AB 2-0 SH 27XBRD (SUTURE) IMPLANT
SUT VIC AB 3-0 FS2 27 (SUTURE) IMPLANT
SUTURE FIBERWR #2 38 T-5 BLUE (SUTURE) IMPLANT
TAPE FIBER 2MM 7IN #2 BLUE (SUTURE) IMPLANT
TOWEL OR 17X24 6PK STRL BLUE (TOWEL DISPOSABLE) ×2 IMPLANT
TOWEL OR NON WOVEN STRL DISP B (DISPOSABLE) ×4 IMPLANT
WAND STAR VAC 90 (SURGICAL WAND) ×2 IMPLANT
WATER STERILE IRR 1000ML POUR (IV SOLUTION) ×2 IMPLANT
YANKAUER SUCT BULB TIP NO VENT (SUCTIONS) IMPLANT

## 2013-11-26 NOTE — Discharge Instructions (Signed)
Remove your dressings on Tuesday and you may shower then, replace with bandaids  Wear your sling for comfort but you may have it off if pain free  Wear your sling until the block wears off Post Anesthesia Home Care Instructions  Activity: Get plenty of rest for the remainder of the day. A responsible adult should stay with you for 24 hours following the procedure.  For the next 24 hours, DO NOT: -Drive a car -Paediatric nurse -Drink alcoholic beverages -Take any medication unless instructed by your physician -Make any legal decisions or sign important papers.  Meals: Start with liquid foods such as gelatin or soup. Progress to regular foods as tolerated. Avoid greasy, spicy, heavy foods. If nausea and/or vomiting occur, drink only clear liquids until the nausea and/or vomiting subsides. Call your physician if vomiting continues.  Special Instructions/Symptoms: Your throat may feel dry or sore from the anesthesia or the breathing tube placed in your throat during surgery. If this causes discomfort, gargle with warm salt water. The discomfort should disappear within 24 hours.

## 2013-11-26 NOTE — Anesthesia Postprocedure Evaluation (Signed)
  Anesthesia Post-op Note  Patient: Chris Odom  Procedure(s) Performed: Procedure(s): RIGHT SHOULDER ARTHROSCOPY WITH SUBACROMIAL DECOMPRESSION AND DISTAL CLAVICLE EXCISION, BICEPS TENOTOMY, EXTENSIVE DEBRIDEMENT, WITH MANIPULATION UNDER ANESTHESIA (Right)  Patient Location: PACU  Anesthesia Type:GA combined with regional for post-op pain  Level of Consciousness: awake and alert   Airway and Oxygen Therapy: Patient Spontanous Breathing  Post-op Pain: mild  Post-op Assessment: Post-op Vital signs reviewed  Post-op Vital Signs: stable  Last Vitals:  Filed Vitals:   11/26/13 1700  BP: 135/79  Pulse: 64  Temp:   Resp: 15    Complications: No apparent anesthesia complications

## 2013-11-26 NOTE — Transfer of Care (Signed)
Immediate Anesthesia Transfer of Care Note  Patient: Chris Odom  Procedure(s) Performed: Procedure(s): RIGHT SHOULDER ARTHROSCOPY WITH SUBACROMIAL DECOMPRESSION AND DISTAL CLAVICLE EXCISION, BICEPS TENOTOMY, EXTENSIVE DEBRIDEMENT, WITH MANIPULATION UNDER ANESTHESIA (Right)  Patient Location: PACU  Anesthesia Type:GA combined with regional for post-op pain  Level of Consciousness: awake, alert , oriented and patient cooperative  Airway & Oxygen Therapy: Patient Spontanous Breathing and Patient connected to face mask oxygen  Post-op Assessment: Report given to PACU RN and Post -op Vital signs reviewed and stable  Post vital signs: Reviewed and stable  Complications: No apparent anesthesia complications

## 2013-11-26 NOTE — Progress Notes (Signed)
Assisted Dr. Massagee with right, ultrasound guided, interscalene  block. Side rails up, monitors on throughout procedure. See vital signs in flow sheet. Tolerated Procedure well. 

## 2013-11-26 NOTE — Anesthesia Procedure Notes (Signed)
Anesthesia Regional Block:  Interscalene brachial plexus block  Pre-Anesthetic Checklist: ,, timeout performed, Correct Patient, Correct Site, Correct Laterality, Correct Procedure, Correct Position, site marked, Risks and benefits discussed, at surgeon's request and post-op pain management  Laterality: Upper and Right  Prep: chloraprep and alcohol swabs       Needles:  Injection technique: Single-shot  Needle Type: Echogenic Needle     Needle Length: 9cm 9 cm Needle Gauge: 21 and 21 G  Needle insertion depth: 5 cm   Additional Needles:  Procedures: ultrasound guided (picture in chart) and nerve stimulator Interscalene brachial plexus block  Nerve Stimulator or Paresthesia:  Response: Twitch elicited, 0.5 mA, 0.3 ms,   Additional Responses:   Narrative:  Start time: 11/26/2013 1:55 PM End time: 11/26/2013 2:10 PM Injection made incrementally with aspirations every 5 mL.  Performed by: Personally   Additional Notes: Block assessed prior to start of surgery

## 2013-11-26 NOTE — Op Note (Signed)
11/26/2013  3:54 PM  PATIENT:  Chris Odom    PRE-OPERATIVE DIAGNOSIS:  other articular cartilage, disorders, right shoulder, primary osteoarthritis, right shoulder, bursitis of right shoulder, bicipital tendinitis, right shoulder  POST-OPERATIVE DIAGNOSIS:  Same  PROCEDURE:  RIGHT SHOULDER ARTHROSCOPY WITH SUBACROMIAL DECOMPRESSION AND DISTAL CLAVICLE EXCISION, BICEPS TENOTOMY, EXTENSIVE DEBRIDEMENT  SURGEON:  Kinza Gouveia, D, MD  ASSISTANT: Doran Stabler C-PA. He was vital and necessary throughout the case.  ANESTHESIA:   General  PREOPERATIVE INDICATIONS:  Chris Odom is a  58 y.o. male with a diagnosis of other articular cartilage, disorders, right shoulder, primary osteoarthritis, right shoulder, bursitis of right shoulder, bicipital tendinitis, right shoulder who failed conservative measures and elected for surgical management.    The risks benefits and alternatives were discussed with the patient preoperatively including but not limited to the risks of infection, bleeding, nerve injury, cardiopulmonary complications, the need for revision surgery, among others, and the patient was willing to proceed.  OPERATIVE IMPLANTS: none  OPERATIVE FINDINGS: SLAP tear, Hooked acromion, distal clavicle overgrowth  BLOOD LOSS: minimal  COMPLICATIONS: none  OPERATIVE PROCEDURE:  Patient was identified in the preoperative holding area and site was marked by me He was transported to the operating theater and placed on the table in beach chair position taking care to pad all bony prominences. After a preincinduction time out anesthesia was induced. The right upper extremity was prepped and draped in normal sterile fashion and a pre-incision timeout was performed. Chris Odom received ancef for preoperative antibiotics.   I performed a firm but gentle manipulation under anesthesia and had palpable breakup of scar tissue and improvement in range of motion.  Initially made a  posterior arthroscopic portal and inserted the arthroscope into the glenohumeral joint. tour of the joint demonstrated the above operative findings  I created an anterior portal just lateral to the coracoid under direct visualization using a spinal needle.  I performed an extensive debridement of the scarred synovial tissue and remaining structures   I used a combination of biter and shaver to release the biceps tendon from the superior labrum and then used the shaver to debride the superior labrum to a smooth rim.  I examined the posterior superior labrum and I do not feel that this was an instability Bankart tear but was more in extension of the SLAP tear. After doing a biceps tenotomy I elected not to perform a repair of the superior labrum. He was also noted to have a Buford complex.  I then introduced the arthroscope into the subacromial space and brought the shaver into the anterior portal. I debrided the bursa for appropriate visualization.  I then performed a subacromial decompression using combination of the shaver ArthroCare and burr using a cutting block technique. As happy with the final elevation of the subacromial space on multiple portal views.   Next I turned my attention to the distal clavicle and through the anterior portal using the bur and shaver I was able to perform a distal clavicle excision. I then switched portals and inserted the arthroscope into the anterior portal and was happy with an appropriate resection of the distal clavicle.  The rotator cuff was intact  I injected 80mg  of depomedrol into the subacromial space  Next I removed all arthroscopic equipment expressed all fluid and closed the portals with a nylon stitch. A sterile dressing was applied the patient was taken the PACU in stable condition.  POST OPERATIVE PLAN: The patient will be in a  sling full-time and keep the dressings clean dry and intact. DVT prophylaxis will consist of early ambulation  This  note was generated using a template and dragon dictation system. In light of that, I have reviewed the note and all aspects of it are applicable to this case. Any dictation errors are due to the computerized dictation system.

## 2013-11-26 NOTE — Interval H&P Note (Signed)
History and Physical Interval Note:  11/26/2013 8:24 AM  Chris Odom  has presented today for surgery, with the diagnosis of other articular cartilage, disorders, right shoulder, primary osteoarthritis, right shoulder, bursitis of right shoulder, bicipital tendinitis, right shoulder  The various methods of treatment have been discussed with the patient and family. After consideration of risks, benefits and other options for treatment, the patient has consented to  Procedure(s): RIGHT SHOULDER ARTHROSCOPY WITH SUBACROMIAL DECOMPRESSION AND DISTAL CLAVICLE EXCISION, BICEPS TENODESIS (Right) as a surgical intervention .  The patient's history has been reviewed, patient examined, no change in status, stable for surgery.  I have reviewed the patient's chart and labs.  Questions were answered to the patient's satisfaction.     Kenitra Leventhal, D

## 2013-11-26 NOTE — Anesthesia Preprocedure Evaluation (Addendum)
Anesthesia Evaluation  Patient identified by MRN, date of birth, ID band Patient awake    Reviewed: Allergy & Precautions, NPO status   Airway Mallampati: II       Dental  (+) Teeth Intact   Pulmonary COPDformer smoker,  breath sounds clear to auscultation        Cardiovascular Rhythm:Regular Rate:Normal     Neuro/Psych    GI/Hepatic GERD-  Medicated,  Endo/Other    Renal/GU      Musculoskeletal   Abdominal   Peds  Hematology   Anesthesia Other Findings   Reproductive/Obstetrics                            Anesthesia Physical Anesthesia Plan  ASA: II  Anesthesia Plan: General   Post-op Pain Management:    Induction: Intravenous  Airway Management Planned: Oral ETT  Additional Equipment:   Intra-op Plan:   Post-operative Plan: Extubation in OR  Informed Consent: I have reviewed the patients History and Physical, chart, labs and discussed the procedure including the risks, benefits and alternatives for the proposed anesthesia with the patient or authorized representative who has indicated his/her understanding and acceptance.     Plan Discussed with: CRNA and Surgeon  Anesthesia Plan Comments:         Anesthesia Quick Evaluation

## 2013-12-09 ENCOUNTER — Encounter (HOSPITAL_BASED_OUTPATIENT_CLINIC_OR_DEPARTMENT_OTHER): Payer: Self-pay | Admitting: Orthopedic Surgery

## 2013-12-22 ENCOUNTER — Encounter (HOSPITAL_BASED_OUTPATIENT_CLINIC_OR_DEPARTMENT_OTHER): Payer: Self-pay | Admitting: *Deleted

## 2013-12-22 NOTE — Progress Notes (Signed)
Pt had rt shoulder 11/15-did well-did not have to stay

## 2013-12-24 ENCOUNTER — Encounter (HOSPITAL_BASED_OUTPATIENT_CLINIC_OR_DEPARTMENT_OTHER)
Admission: RE | Disposition: A | Discharge status: Home / Self Care | Payer: Self-pay | Source: Ambulatory Visit | Attending: Orthopedic Surgery

## 2013-12-24 ENCOUNTER — Ambulatory Visit (HOSPITAL_BASED_OUTPATIENT_CLINIC_OR_DEPARTMENT_OTHER): Payer: BC Managed Care – PPO | Admitting: Anesthesiology

## 2013-12-24 ENCOUNTER — Encounter (HOSPITAL_BASED_OUTPATIENT_CLINIC_OR_DEPARTMENT_OTHER): Payer: Self-pay | Admitting: Certified Registered"

## 2013-12-24 ENCOUNTER — Ambulatory Visit (HOSPITAL_BASED_OUTPATIENT_CLINIC_OR_DEPARTMENT_OTHER)
Admission: RE | Admit: 2013-12-24 | Discharge: 2013-12-24 | Disposition: A | Discharge status: Home / Self Care | Payer: BC Managed Care – PPO | Source: Ambulatory Visit | Attending: Orthopedic Surgery | Admitting: Orthopedic Surgery

## 2013-12-24 DIAGNOSIS — M19012 Primary osteoarthritis, left shoulder: Secondary | ICD-10-CM | POA: Insufficient documentation

## 2013-12-24 DIAGNOSIS — S43432A Superior glenoid labrum lesion of left shoulder, initial encounter: Secondary | ICD-10-CM | POA: Diagnosis not present

## 2013-12-24 DIAGNOSIS — M7522 Bicipital tendinitis, left shoulder: Secondary | ICD-10-CM | POA: Diagnosis not present

## 2013-12-24 DIAGNOSIS — Z87891 Personal history of nicotine dependence: Secondary | ICD-10-CM | POA: Diagnosis not present

## 2013-12-24 DIAGNOSIS — Y9289 Other specified places as the place of occurrence of the external cause: Secondary | ICD-10-CM | POA: Insufficient documentation

## 2013-12-24 DIAGNOSIS — Y9389 Activity, other specified: Secondary | ICD-10-CM | POA: Insufficient documentation

## 2013-12-24 DIAGNOSIS — J449 Chronic obstructive pulmonary disease, unspecified: Secondary | ICD-10-CM | POA: Diagnosis not present

## 2013-12-24 DIAGNOSIS — X58XXXA Exposure to other specified factors, initial encounter: Secondary | ICD-10-CM | POA: Insufficient documentation

## 2013-12-24 DIAGNOSIS — Y998 Other external cause status: Secondary | ICD-10-CM | POA: Diagnosis not present

## 2013-12-24 DIAGNOSIS — M75102 Unspecified rotator cuff tear or rupture of left shoulder, not specified as traumatic: Secondary | ICD-10-CM | POA: Insufficient documentation

## 2013-12-24 HISTORY — PX: SHOULDER ARTHROSCOPY WITH BICEPSTENOTOMY: SHX6204

## 2013-12-24 LAB — POCT HEMOGLOBIN-HEMACUE: HEMOGLOBIN: 15.3 g/dL (ref 13.0–17.0)

## 2013-12-24 SURGERY — SHOULDER ARTHROSCOPY WITH BICEPS TENOTOMY
Anesthesia: Regional | Site: Shoulder | Laterality: Left

## 2013-12-24 MED ORDER — OXYCODONE HCL 5 MG PO TABS
5.0000 mg | ORAL_TABLET | Freq: Once | ORAL | Status: AC | PRN
  Administered 2013-12-24: 5 mg via ORAL

## 2013-12-24 MED ORDER — CEFAZOLIN SODIUM-DEXTROSE 2-3 GM-% IV SOLR
2.0000 g | INTRAVENOUS | Status: AC
  Administered 2013-12-24: 2 g via INTRAVENOUS

## 2013-12-24 MED ORDER — HYDROMORPHONE HCL 1 MG/ML IJ SOLN
0.2500 mg | INTRAMUSCULAR | Status: DC | PRN
  Administered 2013-12-24 (×4): 0.5 mg via INTRAVENOUS

## 2013-12-24 MED ORDER — SODIUM CHLORIDE 0.9 % IR SOLN
Status: DC | PRN
  Administered 2013-12-24 (×3): 3000 mL

## 2013-12-24 MED ORDER — DEXAMETHASONE SODIUM PHOSPHATE 4 MG/ML IJ SOLN
INTRAMUSCULAR | Status: DC | PRN
  Administered 2013-12-24 (×2): 10 mg via INTRAVENOUS

## 2013-12-24 MED ORDER — SUCCINYLCHOLINE CHLORIDE 20 MG/ML IJ SOLN
INTRAMUSCULAR | Status: DC | PRN
  Administered 2013-12-24: 100 mg via INTRAVENOUS

## 2013-12-24 MED ORDER — EPHEDRINE SULFATE 50 MG/ML IJ SOLN
INTRAMUSCULAR | Status: DC | PRN
  Administered 2013-12-24: 10 mg via INTRAVENOUS

## 2013-12-24 MED ORDER — DOCUSATE SODIUM 100 MG PO CAPS: 100.0000 mg | ORAL_CAPSULE | Freq: Two times a day (BID) | ORAL | Status: DC

## 2013-12-24 MED ORDER — ONDANSETRON HCL 4 MG/2ML IJ SOLN
INTRAMUSCULAR | Status: DC | PRN
  Administered 2013-12-24: 4 mg via INTRAVENOUS

## 2013-12-24 MED ORDER — LIDOCAINE HCL (CARDIAC) 20 MG/ML IV SOLN
INTRAVENOUS | Status: DC | PRN
  Administered 2013-12-24: 50 mg via INTRAVENOUS

## 2013-12-24 MED ORDER — MEPERIDINE HCL 25 MG/ML IJ SOLN: 6.2500 mg | INTRAMUSCULAR | Status: DC | PRN

## 2013-12-24 MED ORDER — MIDAZOLAM HCL 2 MG/2ML IJ SOLN
INTRAMUSCULAR | Status: AC
  Filled 2013-12-24: qty 2

## 2013-12-24 MED ORDER — FENTANYL CITRATE 0.05 MG/ML IJ SOLN
INTRAMUSCULAR | Status: AC
  Filled 2013-12-24: qty 2

## 2013-12-24 MED ORDER — DEXTROSE-NACL 5-0.45 % IV SOLN: 100.0000 mL/h | INTRAVENOUS | Status: DC

## 2013-12-24 MED ORDER — FENTANYL CITRATE 0.05 MG/ML IJ SOLN
INTRAMUSCULAR | Status: AC
  Filled 2013-12-24: qty 6

## 2013-12-24 MED ORDER — MIDAZOLAM HCL 2 MG/2ML IJ SOLN
1.0000 mg | INTRAMUSCULAR | Status: DC | PRN
  Administered 2013-12-24: 2 mg via INTRAVENOUS

## 2013-12-24 MED ORDER — PROPOFOL 10 MG/ML IV BOLUS
INTRAVENOUS | Status: DC | PRN
  Administered 2013-12-24: 150 mg via INTRAVENOUS

## 2013-12-24 MED ORDER — OXYCODONE HCL 5 MG/5ML PO SOLN: 5.0000 mg | Freq: Once | ORAL | Status: AC | PRN

## 2013-12-24 MED ORDER — CEFAZOLIN SODIUM-DEXTROSE 2-3 GM-% IV SOLR
INTRAVENOUS | Status: AC
  Filled 2013-12-24: qty 50

## 2013-12-24 MED ORDER — CYCLOBENZAPRINE HCL 5 MG PO TABS: 5.0000 mg | ORAL_TABLET | Freq: Three times a day (TID) | ORAL | Status: DC | PRN

## 2013-12-24 MED ORDER — HYDROMORPHONE HCL 1 MG/ML IJ SOLN
INTRAMUSCULAR | Status: AC
  Filled 2013-12-24: qty 1

## 2013-12-24 MED ORDER — OXYCODONE-ACETAMINOPHEN 5-325 MG PO TABS: 2.0000 | ORAL_TABLET | ORAL | Status: DC | PRN

## 2013-12-24 MED ORDER — LACTATED RINGERS IV SOLN
INTRAVENOUS | Status: DC
  Administered 2013-12-24: 14:00:00 via INTRAVENOUS

## 2013-12-24 MED ORDER — FENTANYL CITRATE 0.05 MG/ML IJ SOLN
50.0000 ug | INTRAMUSCULAR | Status: DC | PRN
  Administered 2013-12-24: 100 ug via INTRAVENOUS

## 2013-12-24 MED ORDER — FENTANYL CITRATE 0.05 MG/ML IJ SOLN
INTRAMUSCULAR | Status: DC | PRN
  Administered 2013-12-24: 50 ug via INTRAVENOUS

## 2013-12-24 MED ORDER — OXYCODONE HCL 5 MG PO TABS
ORAL_TABLET | ORAL | Status: AC
  Filled 2013-12-24: qty 1

## 2013-12-24 MED ORDER — KETOROLAC TROMETHAMINE 30 MG/ML IJ SOLN: 30.0000 mg | Freq: Four times a day (QID) | INTRAMUSCULAR | Status: DC | PRN

## 2013-12-24 MED ORDER — ONDANSETRON HCL 4 MG/2ML IJ SOLN: 4.0000 mg | Freq: Once | INTRAMUSCULAR | Status: DC | PRN

## 2013-12-24 MED ORDER — ONDANSETRON HCL 4 MG PO TABS: 4.0000 mg | ORAL_TABLET | Freq: Three times a day (TID) | ORAL | Status: DC | PRN

## 2013-12-24 MED ORDER — ACETAMINOPHEN 500 MG PO TABS
1000.0000 mg | ORAL_TABLET | Freq: Once | ORAL | Status: AC
  Administered 2013-12-24: 1000 mg via ORAL

## 2013-12-24 MED ORDER — ACETAMINOPHEN 500 MG PO TABS
ORAL_TABLET | ORAL | Status: AC
  Filled 2013-12-24: qty 2

## 2013-12-24 MED ORDER — KETOROLAC TROMETHAMINE 30 MG/ML IJ SOLN
INTRAMUSCULAR | Status: AC
  Filled 2013-12-24: qty 1

## 2013-12-24 SURGICAL SUPPLY — 71 items
BLADE CUTTER GATOR 3.5 (BLADE) ×3 IMPLANT
BLADE CUTTER MENIS 5.5 (BLADE) IMPLANT
BLADE GREAT WHITE 4.2 (BLADE) IMPLANT
BLADE GREAT WHITE 4.2MM (BLADE)
BLADE SURG 15 STRL LF DISP TIS (BLADE) IMPLANT
BLADE SURG 15 STRL SS (BLADE)
BUR OVAL 4.0 (BURR) IMPLANT
BUR OVAL 6.0 (BURR) ×3 IMPLANT
CANISTER SUCT 3000ML (MISCELLANEOUS) IMPLANT
CANNULA 5.75X71 LONG (CANNULA) IMPLANT
CANNULA TWIST IN 8.25X7CM (CANNULA) IMPLANT
CHLORAPREP W/TINT 26ML (MISCELLANEOUS) ×3 IMPLANT
CLOSURE STERI-STRIP 1/2X4 (GAUZE/BANDAGES/DRESSINGS) ×1
CLSR STERI-STRIP ANTIMIC 1/2X4 (GAUZE/BANDAGES/DRESSINGS) ×2 IMPLANT
DRAPE SHOULDER BEACH CHAIR (DRAPES) ×6 IMPLANT
DRAPE STERI 35X30 U-POUCH (DRAPES) ×3 IMPLANT
DRAPE U 20/CS (DRAPES) ×3 IMPLANT
DRAPE U-SHAPE 47X51 STRL (DRAPES) ×3 IMPLANT
DRSG EMULSION OIL 3X3 NADH (GAUZE/BANDAGES/DRESSINGS) ×3 IMPLANT
DRSG PAD ABDOMINAL 8X10 ST (GAUZE/BANDAGES/DRESSINGS) ×3 IMPLANT
ELECT NEEDLE TIP 2.8 STRL (NEEDLE) IMPLANT
ELECT REM PT RETURN 9FT ADLT (ELECTROSURGICAL)
ELECTRODE REM PT RTRN 9FT ADLT (ELECTROSURGICAL) IMPLANT
GAUZE SPONGE 4X4 12PLY STRL (GAUZE/BANDAGES/DRESSINGS) ×6 IMPLANT
GLOVE BIO SURGEON STRL SZ7.5 (GLOVE) ×3 IMPLANT
GLOVE BIO SURGEON STRL SZ8 (GLOVE) IMPLANT
GLOVE BIOGEL PI IND STRL 8 (GLOVE) ×2 IMPLANT
GLOVE BIOGEL PI INDICATOR 8 (GLOVE) ×4
GOWN STRL REUS W/ TWL LRG LVL3 (GOWN DISPOSABLE) ×3 IMPLANT
GOWN STRL REUS W/ TWL XL LVL3 (GOWN DISPOSABLE) ×1 IMPLANT
GOWN STRL REUS W/TWL LRG LVL3 (GOWN DISPOSABLE) ×6
GOWN STRL REUS W/TWL XL LVL3 (GOWN DISPOSABLE) ×2
MANIFOLD NEPTUNE II (INSTRUMENTS) ×3 IMPLANT
NDL SUT 6 .5 CRC .975X.05 MAYO (NEEDLE) IMPLANT
NEEDLE MAYO TAPER (NEEDLE)
NEEDLE SCORPION MULTI FIRE (NEEDLE) IMPLANT
NS IRRIG 1000ML POUR BTL (IV SOLUTION) IMPLANT
PACK ARTHROSCOPY DSU (CUSTOM PROCEDURE TRAY) ×3 IMPLANT
PACK BASIN DAY SURGERY FS (CUSTOM PROCEDURE TRAY) ×3 IMPLANT
PENCIL BUTTON HOLSTER BLD 10FT (ELECTRODE) IMPLANT
SET ARTHROSCOPY TUBING (MISCELLANEOUS) ×2
SET ARTHROSCOPY TUBING LN (MISCELLANEOUS) ×1 IMPLANT
SLEEVE SCD COMPRESS KNEE MED (MISCELLANEOUS) ×3 IMPLANT
SLING ARM FOAM STRAP LRG (SOFTGOODS) ×3 IMPLANT
SLING ARM IMMOBILIZER LRG (SOFTGOODS) IMPLANT
SLING ARM IMMOBILIZER MED (SOFTGOODS) IMPLANT
SLING ARM LRG ADULT FOAM STRAP (SOFTGOODS) IMPLANT
SLING ARM MED ADULT FOAM STRAP (SOFTGOODS) IMPLANT
SLING ARM XL FOAM STRAP (SOFTGOODS) IMPLANT
SPONGE GAUZE 4X4 12PLY STER LF (GAUZE/BANDAGES/DRESSINGS) ×3 IMPLANT
SPONGE LAP 4X18 X RAY DECT (DISPOSABLE) IMPLANT
SUCTION FRAZIER TIP 10 FR DISP (SUCTIONS) IMPLANT
SUT ETHIBOND 2 OS 4 DA (SUTURE) IMPLANT
SUT ETHILON 2 0 FS 18 (SUTURE) IMPLANT
SUT ETHILON 3 0 PS 1 (SUTURE) IMPLANT
SUT FIBERWIRE #2 38 T-5 BLUE (SUTURE)
SUT MNCRL AB 4-0 PS2 18 (SUTURE) ×3 IMPLANT
SUT TIGER TAPE 7 IN WHITE (SUTURE) IMPLANT
SUT VIC AB 0 CT1 27 (SUTURE)
SUT VIC AB 0 CT1 27XBRD ANBCTR (SUTURE) IMPLANT
SUT VIC AB 2-0 SH 27 (SUTURE)
SUT VIC AB 2-0 SH 27XBRD (SUTURE) IMPLANT
SUT VIC AB 3-0 FS2 27 (SUTURE) IMPLANT
SUTURE FIBERWR #2 38 T-5 BLUE (SUTURE) IMPLANT
TAPE CLOTH SURG 6X10 WHT LF (GAUZE/BANDAGES/DRESSINGS) ×3 IMPLANT
TAPE FIBER 2MM 7IN #2 BLUE (SUTURE) IMPLANT
TOWEL OR 17X24 6PK STRL BLUE (TOWEL DISPOSABLE) ×3 IMPLANT
TOWEL OR NON WOVEN STRL DISP B (DISPOSABLE) ×3 IMPLANT
WAND STAR VAC 90 (SURGICAL WAND) ×3 IMPLANT
WATER STERILE IRR 1000ML POUR (IV SOLUTION) ×3 IMPLANT
YANKAUER SUCT BULB TIP NO VENT (SUCTIONS) IMPLANT

## 2013-12-24 NOTE — Transfer of Care (Signed)
Immediate Anesthesia Transfer of Care Note  Patient: Chris Odom  Procedure(s) Performed: Procedure(s) with comments: LEFT SHOULDER SCOPE DEBRIDEMENT, AND BICEPS TENODotomy Distal clavicle excision,subacromial decompression (Left) - ANESTHESIA: GENERAL, PRE/POST OP SCALENE  Patient Location: PACU  Anesthesia Type:GA combined with regional for post-op pain  Level of Consciousness: awake, alert  and patient cooperative  Airway & Oxygen Therapy: Patient Spontanous Breathing and Patient connected to face mask oxygen  Post-op Assessment: Report given to PACU RN, Post -op Vital signs reviewed and stable and Patient moving all extremities  Post vital signs: Reviewed and stable  Complications: No apparent anesthesia complications

## 2013-12-24 NOTE — H&P (Signed)
ORTHOPAEDIC CONSULTATION  REQUESTING PHYSICIAN: Renette Butters, MD  Chief Complaint: Left shoulder SLAP, cuff tear, distal clavicle arthritis biceps tendonitis  HPI: Chris Odom is a 58 y.o. male who complains of  pain  Past Medical History  Diagnosis Date  . Chronic kidney disease     hx stones  . GERD (gastroesophageal reflux disease)   . COPD (chronic obstructive pulmonary disease)     a little -due to smoking- no problems recently  . Angina   . Wears glasses    Past Surgical History  Procedure Laterality Date  . Foot neuroma surgery  10    rt  . Hand surgery  98    rt hand gangion, wrist ganglion  . Knee arthroscopy  05    ganglion cyst  . Kidney stone surgery      cysto  . Cervical fusion  01/2011    C 3 -C7  . Colonoscopy  2009    internal hemorrhoids  . Excisional hemorrhoidectomy  2014  . Incision and drainage abscess Left 12/04/2012    Procedure: INCISION AND DRAINAGE ABSCESS;  Surgeon: Tennis Must, MD;  Location: [facility];  Service: Orthopedics;  Laterality: Left;  i and d np joint abcess  left thumb   . I&d extremity Left 12/22/2012    Procedure: IRRIGATION AND DEBRIDEMENT LEFT THUMB/HAND ;  Surgeon: Tennis Must, MD;  Location: [facility];  Service: Orthopedics;  Laterality: Left;  . Incision and drainage abscess Left 01/05/2013    Procedure: INCISION AND DRAINAGE LEFT THUMB;  Surgeon: Tennis Must, MD;  Location: [facility];  Service: Orthopedics;  Laterality: Left;  . Thumb arthroscopy  1/15    right-[facility]   History   Social History  . Marital Status: Married    Spouse Name: N/A    Number of Children: N/A  . Years of Education: N/A   Social History Main Topics  . Smoking status: Former Smoker -- 2.50 packs/day for 30 years    Quit date: 06/19/2005  . Smokeless tobacco: Never Used  . Alcohol Use: No  . Drug Use: No  . Sexual Activity: Yes   Other Topics Concern  . None    Social History Narrative   Family History  Problem Relation Age of Onset  . Hypertension Mother   . Diabetes Father    No Known Allergies Prior to Admission medications   Medication Sig Start Date End Date Taking? Authorizing Provider  docusate sodium (COLACE) 100 MG capsule Take 1 capsule (100 mg total) by mouth 2 (two) times daily. Continue this while taking narcotics to help with bowel movements 11/26/13   Renette Butters, MD  HYDROcodone-acetaminophen [facility]) 5-325 MG per tablet Take 2 tablets by mouth every 4 (four) hours as needed. 11/26/13   Renette Butters, MD  methocarbamol (ROBAXIN) 500 MG tablet Take 1 tablet (500 mg total) by mouth every 6 (six) hours as needed. 11/26/13   Renette Butters, MD  omeprazole (PRILOSEC OTC) 20 MG tablet Take 20 mg by mouth daily.    Historical Provider, MD  ondansetron (ZOFRAN) 4 MG tablet Take 1 tablet (4 mg total) by mouth every 8 (eight) hours as needed for nausea or vomiting. 11/26/13   Renette Butters, MD   No results found.  Positive ROS: All other systems have been reviewed and were otherwise negative with the exception of those mentioned in the HPI and as above.  Labs cbc No results for input(s): WBC, HGB, HCT, PLT in the last 72 hours.  Labs inflam No results for input(s): CRP in the last 72 hours.  Invalid input(s): ESR  Labs coag No results for input(s): INR, PTT in the last 72 hours.  Invalid input(s): PT  No results for input(s): NA, K, CL, CO2, GLUCOSE, BUN, CREATININE, CALCIUM in the last 72 hours.  Physical Exam: There were no vitals filed for this visit. General: Alert, no acute distress Cardiovascular: No pedal edema Respiratory: No cyanosis, no use of accessory musculature GI: No organomegaly, abdomen is soft and non-tender Skin: No lesions in the area of chief complaint other than those listed below in MSK exam.  Neurologic: Sensation intact distally Psychiatric: Patient is competent for consent with normal  mood and affect Lymphatic: No axillary or cervical lymphadenopathy  MUSCULOSKELETAL:   Other extremities are atraumatic with painless ROM and NVI.  Assessment: Left shoulder SLAP, cuff tear, distal clavicle arthritis biceps tendonitis  Plan: Left shoulder scope    Edmonia Lynch, D, MD Cell [phone]   12/24/2013 12:51 PM

## 2013-12-24 NOTE — Discharge Instructions (Signed)
Remove dressings in 4 days and replace with bandaids, you may shower then.  Sling for comfort   Post Anesthesia Home Care Instructions  Activity: Get plenty of rest for the remainder of the day. A responsible adult should stay with you for 24 hours following the procedure.  For the next 24 hours, DO NOT: -Drive a car -Paediatric nurse -Drink alcoholic beverages -Take any medication unless instructed by your physician -Make any legal decisions or sign important papers.  Meals: Start with liquid foods such as gelatin or soup. Progress to regular foods as tolerated. Avoid greasy, spicy, heavy foods. If nausea and/or vomiting occur, drink only clear liquids until the nausea and/or vomiting subsides. Call your physician if vomiting continues.  Special Instructions/Symptoms: Your throat may feel dry or sore from the anesthesia or the breathing tube placed in your throat during surgery. If this causes discomfort, gargle with warm salt water. The discomfort should disappear within 24 hours.  Regional Anesthesia Blocks  1. Numbness or the inability to move the "blocked" extremity may last from 3-48 hours after placement. The length of time depends on the medication injected and your individual response to the medication. If the numbness is not going away after 48 hours, call your surgeon.  2. The extremity that is blocked will need to be protected until the numbness is gone and the  Strength has returned. Because you cannot feel it, you will need to take extra care to avoid injury. Because it may be weak, you may have difficulty moving it or using it. You may not know what position it is in without looking at it while the block is in effect.  3. For blocks in the legs and feet, returning to weight bearing and walking needs to be done carefully. You will need to wait until the numbness is entirely gone and the strength has returned. You should be able to move your leg and foot normally before you  try and bear weight or walk. You will need someone to be with you when you first try to ensure you do not fall and possibly risk injury.  4. Bruising and tenderness at the needle site are common side effects and will resolve in a few days.  5. Persistent numbness or new problems with movement should be communicated to the surgeon or the [place] [phone] [place] [phone]).

## 2013-12-24 NOTE — Anesthesia Procedure Notes (Signed)
Procedure Name: Intubation Performed by: Chidi Shirer, Churdan Pre-anesthesia Checklist: Patient identified, Emergency Drugs available, Suction available and Patient being monitored Patient Re-evaluated:Patient Re-evaluated prior to inductionOxygen Delivery Method: Circle System Utilized Preoxygenation: Pre-oxygenation with 100% oxygen Intubation Type: IV induction Ventilation: Mask ventilation without difficulty Laryngoscope Size: Mac and 3 Grade View: Grade I Tube type: Oral Tube size: 7.0 mm Number of attempts: 1 Airway Equipment and Method: stylet and oral airway Placement Confirmation: ETT inserted through vocal cords under direct vision,  positive ETCO2 and breath sounds checked- equal and bilateral Tube secured with: Tape Dental Injury: Teeth and Oropharynx as per pre-operative assessment      

## 2013-12-24 NOTE — Op Note (Signed)
12/24/2013  3:57 PM  PATIENT:  Chris Odom    PRE-OPERATIVE DIAGNOSIS:  OTHER ARTICULAR CARTILAGE DISORDERS/LEFT SHOULDER STRAIN OF MUSCLE (S)/TENDON (S) OF THE ROTATOR CUFF OF LEFT SHOULDER INITIAL ENCOUNTER BICIPITAL TENDINITIS, LEFT SHOULDER  POST-OPERATIVE DIAGNOSIS:  Same  PROCEDURE:  LEFT SHOULDER SCOPE DEBRIDEMENT, AND BICEPS TENODotomy Distal clavicle excision,subacromial decompression  SURGEON:  MURPHY, TIMOTHY, D, MD  ASSISTANT: none.  ANESTHESIA:   General  PREOPERATIVE INDICATIONS:  Chris Odom is a  58 y.o. male with a diagnosis of OTHER ARTICULAR CARTILAGE DISORDERS/LEFT SHOULDER STRAIN OF MUSCLE (S)/TENDON (S) OF THE ROTATOR CUFF OF LEFT SHOULDER INITIAL ENCOUNTER BICIPITAL TENDINITIS, LEFT SHOULDER who failed conservative measures and elected for surgical management.    The risks benefits and alternatives were discussed with the patient preoperatively including but not limited to the risks of infection, bleeding, nerve injury, cardiopulmonary complications, the need for revision surgery, among others, and the patient was willing to proceed.  OPERATIVE IMPLANTS: none  OPERATIVE FINDINGS: Stable cuff without tear, SLAP tear  BLOOD LOSS: minimal  COMPLICATIONS: none  OPERATIVE PROCEDURE:  Patient was identified in the preoperative holding area and site was marked by me He was transported to the operating theater and placed on the table in beach chair position taking care to pad all bony prominences. After a preincinduction time out anesthesia was induced. The left upper extremity was prepped and draped in normal sterile fashion and a pre-incision timeout was performed. Chris Odom received ancef for preoperative antibiotics.   Initially made a posterior arthroscopic portal and inserted the arthroscope into the glenohumeral joint. tour of the joint demonstrated the above operative findings  I created an anterior portal just lateral to the coracoid under  direct visualization using a spinal needle.  I performed an extensive debridement of the scarred synovial tissue and remaining structures  I used a combination of biter and shaver to release the biceps tendon from the superior labrum and then used the shaver to debride the superior labrum to a smooth rim.  I then introduced the arthroscope into the subacromial space and brought the shaver into the anterior portal. I debrided the bursa for appropriate visualization.  I then performed a subacromial decompression using combination of the shaver ArthroCare and burr using a cutting block technique. As happy with the final elevation of the subacromial space on multiple portal views.   Next I turned my attention to the distal clavicle and through the anterior portal using the bur and shaver I was able to perform a distal clavicle excision. I then switched portals and inserted the arthroscope into the anterior portal and was happy with an appropriate resection of the distal clavicle.  Next I removed all arthroscopic equipment expressed all fluid and closed the portals with a nylon stitch. A sterile dressing was applied the patient was taken the PACU in stable condition.  POST OPERATIVE PLAN: The patient will be in a sling full-time and keep the dressings clean dry and intact. DVT prophylaxis will consist of early ambulation  This note was generated using a template and dragon dictation system. In light of that, I have reviewed the note and all aspects of it are applicable to this case. Any dictation errors are due to the computerized dictation system.

## 2013-12-24 NOTE — Progress Notes (Signed)
AssistedDr. Ossey with left, ultrasound guided, interscalene  block. Side rails up, monitors on throughout procedure. See vital signs in flow sheet. Tolerated Procedure well.  

## 2013-12-24 NOTE — Anesthesia Preprocedure Evaluation (Signed)
Anesthesia Evaluation  Patient identified by MRN, date of birth, ID band Patient awake    Reviewed: Allergy & Precautions, H&P , NPO status , Patient's Chart, lab work & pertinent test results  Airway Mallampati: I  TM Distance: >3 FB Neck ROM: Full    Dental   Pulmonary COPDformer smoker,          Cardiovascular     Neuro/Psych    GI/Hepatic GERD-  Medicated and Controlled,  Endo/Other    Renal/GU CRFRenal disease     Musculoskeletal   Abdominal   Peds  Hematology   Anesthesia Other Findings   Reproductive/Obstetrics                             Anesthesia Physical Anesthesia Plan  ASA: III  Anesthesia Plan: General   Post-op Pain Management:    Induction: Intravenous  Airway Management Planned: Oral ETT  Additional Equipment:   Intra-op Plan:   Post-operative Plan: Extubation in OR  Informed Consent: I have reviewed the patients History and Physical, chart, labs and discussed the procedure including the risks, benefits and alternatives for the proposed anesthesia with the patient or authorized representative who has indicated his/her understanding and acceptance.     Plan Discussed with: CRNA and Surgeon  Anesthesia Plan Comments:         Anesthesia Quick Evaluation

## 2013-12-24 NOTE — Anesthesia Postprocedure Evaluation (Signed)
Anesthesia Post Note  Patient: Chris Odom  Procedure(s) Performed: Procedure(s) (LRB): LEFT SHOULDER SCOPE DEBRIDEMENT, AND BICEPS TENODotomy Distal clavicle excision,subacromial decompression (Left)  Anesthesia type: general  Patient location: PACU  Post pain: Pain level controlled  Post assessment: Patient's Cardiovascular Status Stable  Last Vitals:  Filed Vitals:   12/24/13 1759  BP: 144/83  Pulse: 69  Temp: 36.4 C  Resp: 18    Post vital signs: Reviewed and stable  Level of consciousness: sedated  Complications: No apparent anesthesia complications

## 2013-12-27 ENCOUNTER — Encounter (HOSPITAL_BASED_OUTPATIENT_CLINIC_OR_DEPARTMENT_OTHER): Payer: Self-pay | Admitting: Orthopedic Surgery

## 2014-01-10 ENCOUNTER — Encounter (HOSPITAL_BASED_OUTPATIENT_CLINIC_OR_DEPARTMENT_OTHER): Payer: Self-pay | Admitting: Orthopedic Surgery

## 2014-05-13 NOTE — Op Note (Signed)
PATIENT NAME:  Chris Odom, Chris Odom MR#:  [id number] DATE OF BIRTH:  03-25-1955  DATE OF PROCEDURE:  05/14/2012  PREOPERATIVE DIAGNOSIS: Internal/external hemorrhoids.   POSTOPERATIVE DIAGNOSIS: Internal/external hemorrhoids.   OPERATIVE PROCEDURE: Hemorrhoidectomy.   SURGEON: Hervey Ard, MD   ANESTHESIA: General endotracheal under Dr. Ronelle Nigh, Exparel 20 mL local infiltration.   ESTIMATED BLOOD LOSS: Minimal.   CLINICAL NOTE: This 58 year old male has had symptomatic hemorrhoids unresponsive to conservative measures. He was felt to be a candidate for elective excision. He received Invanz prior to the procedure.   OPERATIVE NOTE: With the patient under general anesthesia, he was rolled to the prone position and carefully padded. The buttocks were taped apart. Exparel was infiltrated for postoperative analgesia. The hemorrhoidal complexes were identified, most prominent ones being anterior. These were dissected with the use of harmonic scalpel. Excellent hemostasis was achieved. The defect was closed with a running locking suture for the mucosa and a subcuticular suture for the skin. A smaller lesion on the posterior wall was treated in a similar fashion. A Vaseline gauze pack was placed for removal in the recovery room. The patient tolerated the procedure well.    ____________________________ Robert Bellow, MD jwb:cc D: 05/14/2012 21:27:26 ET T: 05/14/2012 22:26:05 ET JOB#: [id number]  cc: Robert Bellow, MD, <Dictator> Vickki Muff. [place], PA Daylan Boggess Amedeo Kinsman MD ELECTRONICALLY SIGNED 05/15/2012 9:25

## 2014-05-14 NOTE — Op Note (Signed)
PATIENT NAME:  Chris Odom, Chris Odom MR#:  [id number] DATE OF BIRTH:  12/01/1955  DATE OF PROCEDURE:  02/05/2013  PREOPERATIVE DIAGNOSES: Osteomyelitis, left hand.  POSTOPERATIVE DIAGNOSES: Osteomyelitis, left hand.   PROCEDURES:  1. Ultrasound guidance for vascular access to right basilic vein.  2. Fluoroscopic guidance for placement of catheter.  3. Insertion of peripherally inserted central venous catheter, single lumen, right arm.  SURGEON: Ella Jubilee, MD  ANESTHESIA: Local.   ESTIMATED BLOOD LOSS: Minimal.   INDICATION FOR PROCEDURE: Requiring IV antibiotics greater than 5 days.   DESCRIPTION OF PROCEDURE: The patient's right arm was sterilely prepped and draped, and a sterile surgical field was created. The basilic vein was accessed under direct ultrasound guidance without difficulty with a micropuncture needle and permanent image was recorded. 0.018 wire was then placed into the superior vena cava. Peel-away sheath was placed over the wire. A single lumen peripherally inserted central venous catheter was then placed over the wire and the wire and peel-away sheath were removed. The catheter tip was placed into the superior vena cava and was secured at the skin at 28 cm with a sterile dressing. The catheter withdrew blood well and flushed easily with heparinized saline. The patient tolerated procedure well.   ____________________________ Katha Cabal, MD ggs:np D: 02/09/2013 17:34:46 ET T: 02/09/2013 19:36:48 ET JOB#: [id number]  cc: Katha Cabal, MD, <Dictator> Katha Cabal MD ELECTRONICALLY SIGNED 03/03/2013 13:23

## 2014-09-15 ENCOUNTER — Other Ambulatory Visit: Payer: Self-pay | Admitting: Family Medicine

## 2014-09-15 NOTE — Telephone Encounter (Signed)
Chris Odom patient 

## 2014-10-13 ENCOUNTER — Ambulatory Visit (INDEPENDENT_AMBULATORY_CARE_PROVIDER_SITE_OTHER): Payer: BC Managed Care – PPO | Admitting: Family Medicine

## 2014-10-13 ENCOUNTER — Encounter: Payer: Self-pay | Admitting: Family Medicine

## 2014-10-13 ENCOUNTER — Other Ambulatory Visit: Payer: Self-pay

## 2014-10-13 VITALS — BP 120/74 | HR 58 | Temp 98.3°F | Resp 16 | Wt 211.2 lb

## 2014-10-13 DIAGNOSIS — Z8739 Personal history of other diseases of the musculoskeletal system and connective tissue: Secondary | ICD-10-CM | POA: Diagnosis not present

## 2014-10-13 DIAGNOSIS — K219 Gastro-esophageal reflux disease without esophagitis: Secondary | ICD-10-CM | POA: Diagnosis not present

## 2014-10-13 MED ORDER — OMEPRAZOLE 20 MG PO CPDR: 20.0000 mg | DELAYED_RELEASE_CAPSULE | Freq: Every day | ORAL | Status: DC

## 2014-10-13 MED ORDER — DICLOFENAC SODIUM 2 % TD SOLN: 2.0000 g | Freq: Two times a day (BID) | TRANSDERMAL | Status: DC

## 2014-10-13 NOTE — Progress Notes (Signed)
Patient ID: Chris Odom, male   DOB: Jun 03, 1955, 59 y.o.   MRN: [id number]   Chief Complaint  Patient presents with  . Gastrophageal Reflux    Subjective:  Gastrophageal Reflux He complains of heartburn. This is a chronic problem. The problem has been gradually improving. He has tried an antacid for the symptoms. The treatment provided moderate relief.  Has had dyspepsia with doughnuts or sausage worsening symptoms. No hematemesis. Limiting coffee to 4 cups a day. Has to take Omeprazole 20 mg qd regularly to control reflux symptoms. No melena or hematochezia.  Prior to Admission medications   Medication Sig Start Date End Date Taking? Authorizing Provider  meloxicam (MOBIC) 15 MG tablet Take by mouth.   Yes Historical Provider, MD  omeprazole (PRILOSEC) 20 MG capsule TAKE ONE CAPSULE BY MOUTH DAILY 09/15/14  Yes Birdie Sons, MD  Probiotic Product (PROBIOTIC DAILY PO) Take by mouth.   Yes Historical Provider, MD   Past Surgical History  Procedure Laterality Date  . Foot neuroma surgery  10    rt  . Hand surgery  98    rt hand gangion, wrist ganglion  . Knee arthroscopy  05    ganglion cyst  . Kidney stone surgery      cysto  . Cervical fusion  01/2011    C 3 -C7  . Colonoscopy  2009    internal hemorrhoids  . Excisional hemorrhoidectomy  2014  . Incision and drainage abscess Left 12/04/2012    Procedure: INCISION AND DRAINAGE ABSCESS;  Surgeon: Tennis Must, MD;  Location: [place];  Service: Orthopedics;  Laterality: Left;  i and d np joint abcess  left thumb   . I&d extremity Left 12/22/2012    Procedure: IRRIGATION AND DEBRIDEMENT LEFT THUMB/HAND ;  Surgeon: Tennis Must, MD;  Location: [place];  Service: Orthopedics;  Laterality: Left;  . Incision and drainage abscess Left 01/05/2013    Procedure: INCISION AND DRAINAGE LEFT THUMB;  Surgeon: Tennis Must, MD;  Location: [place];  Service: Orthopedics;  Laterality:  Left;  . Thumb arthroscopy  1/15    right-[facility]  . Shoulder arthroscopy with bicepstenotomy Left 12/24/2013    Procedure: SHOULDER ARTHROSCOPY WITH BICEPSTENOTOMY;  Surgeon: Renette Butters, MD;  Location: [place];  Service: Orthopedics;  Laterality: Left;    Patient Active Problem List   Diagnosis Date Noted  . Acute osteomyelitis involving hand 02/03/2013  . 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 Chronic kidney disease     hx stones  . GERD (gastroesophageal reflux disease)   . COPD (chronic obstructive pulmonary disease)     a little -due to smoking- no problems recently  . Angina   . Wears glasses    Family History  Problem Relation Age of Onset  . Hypertension Mother   . Diabetes Father    Social History   Social History  . Marital Status: Married    Spouse Name: N/A  . Number of Children: N/A  . Years of Education: N/A   Occupational History  . Not on file.   Social History Main Topics  . Smoking status: Former Smoker -- 2.50 packs/day for 30 years    Quit date: 06/19/2005  . Smokeless tobacco: Never Used  . Alcohol Use: No  . Drug Use: No  . Sexual Activity: Yes   Other Topics Concern  . Not on  file   Social History Narrative   No Known Allergies  Review of Systems  Constitutional: Negative.   HENT: Negative.   Eyes: Negative.   Respiratory: Negative.   Cardiovascular: Negative.   Gastrointestinal: Positive for heartburn.  Genitourinary: Negative.   Musculoskeletal: Negative.   Skin: Negative.   Neurological: Negative.   Endo/Heme/Allergies: Negative.   Psychiatric/Behavioral: Negative.    Objective:  BP 120/74 mmHg  Pulse 58  Temp(Src) 98.3 F (36.8 C) (Oral)  Resp 16  Wt 211 lb 3.2 oz (95.8 kg)  SpO2 98%  Physical Exam  Constitutional: He is oriented to person, place, and time and well-developed, well-nourished, and in no distress.  HENT:  Head: Normocephalic and  atraumatic.  Eyes: Conjunctivae are normal.  Cardiovascular: Normal rate and regular rhythm.   Pulmonary/Chest: Breath sounds normal.  Abdominal: Soft. Bowel sounds are normal.  Musculoskeletal:  Bone loss in left thumb from past osteomyelitis. Some swelling today without redness and only a pressure discomfort. Unable to flex thumb.  Neurological: He is alert and oriented to person, place, and time.   Lab Results  Component Value Date   WBC 6.8 01/16/2013   HGB 15.3 12/24/2013   HCT 37.0* 01/16/2013   PLT 242 01/16/2013   GLUCOSE 116* 01/15/2013   CMP     Component Value Date/Time   NA 141 01/15/2013 1935   NA 135 12/29/2012 1006   K 3.8 01/15/2013 1935   K 5.2 12/29/2012 1006   CL 106 01/15/2013 1935   CL 100 12/29/2012 1006   CO2 28 01/15/2013 1935   CO2 27 12/29/2012 1006   GLUCOSE 116* 01/15/2013 1935   GLUCOSE 78 12/29/2012 1006   BUN 11 02/07/2013 0900   BUN 19 12/29/2012 1006   CREATININE 0.79 02/07/2013 0900   CREATININE 1.18 12/29/2012 1006   CREATININE 1.00 02/13/2011 0829   CALCIUM 8.6 01/15/2013 1935   CALCIUM 9.9 12/29/2012 1006   GFRNONAA >60 02/07/2013 0900   GFRNONAA 69 12/29/2012 1006   GFRNONAA 84* 02/13/2011 0829   GFRAA >60 02/07/2013 0900   GFRAA 80 12/29/2012 1006   GFRAA >90 02/13/2011 0829    Assessment and Plan :   1. Gastroesophageal reflux disease, esophagitis presence not specified Persistent dyspepsia that is fairly well controlled by use of Omeprazole daily. Recommend referral to gastroenterologist for upper endoscopy evaluation to rule out ulcerations or Barrett's esophagitis. - Probiotic Product (PROBIOTIC DAILY PO); Take by mouth. - Ambulatory referral to Gastroenterology - omeprazole (PRILOSEC) 20 MG capsule; Take 1 capsule (20 mg total) by mouth daily.  Dispense: 90 capsule; Refill: 1  2. History of osteomyelitis Had a severe infection in the left thumb that required several debridement procedures in 2014. It finally caused  osteomyelitis of the thumb and lost most of the proximal phalange of the left thumb. Still having pain in the left thumb. Will GERD wants to stay away from any systemic NSAID's. Given trial of topical Diclofenac. Recheck prn. Considering return to a hand surgeon for possible joint replacement in the left thumb. - Diclofenac Sodium 2 % SOLN; Place 2 g onto the skin 2 (two) times daily.  Dispense: 8 g; Refill: [facility] 10/13/2014 10:35 AM

## 2014-10-14 ENCOUNTER — Ambulatory Visit: Payer: Self-pay | Admitting: Family Medicine

## 2014-10-20 ENCOUNTER — Telehealth: Payer: Self-pay | Admitting: Family Medicine

## 2014-10-20 DIAGNOSIS — Z8739 Personal history of other diseases of the musculoskeletal system and connective tissue: Secondary | ICD-10-CM

## 2014-10-20 MED ORDER — DICLOFENAC SODIUM 2 % TD SOLN: 2.0000 g | Freq: Two times a day (BID) | TRANSDERMAL | Status: DC

## 2014-10-20 NOTE — Telephone Encounter (Signed)
Patient was seen by Simona Huh on 10/13/2014 for History of Osteomyelitis. Patient was given sample of Diclofenac. Patient request prescription for this medication.

## 2014-10-20 NOTE — Telephone Encounter (Signed)
Pt was end last week and Chris Odom gave her samples of Pennsaid cream.  Pt wants to know if someone could please call him in a prescription for this at [facility].  Patients call back is [phone]  [facility]

## 2014-10-25 ENCOUNTER — Telehealth: Payer: Self-pay | Admitting: Family Medicine

## 2014-10-25 DIAGNOSIS — Z8739 Personal history of other diseases of the musculoskeletal system and connective tissue: Secondary | ICD-10-CM

## 2014-10-25 MED ORDER — DICLOFENAC SODIUM 2 % TD SOLN: 2.0000 g | Freq: Two times a day (BID) | TRANSDERMAL | Status: DC

## 2014-10-25 NOTE — Telephone Encounter (Signed)
Pt contacted office for refill request on the following medications:  Diclofenac Sodium 2 % SOLN.  [place].  CB#[phone]/MW

## 2014-10-25 NOTE — Telephone Encounter (Signed)
Advise patient prescription sent to [facility]. Schedule follow up appointment as needed.

## 2014-10-26 NOTE — Telephone Encounter (Signed)
Left message saying that prescription was ready for pick up.

## 2014-11-14 ENCOUNTER — Encounter: Payer: Self-pay | Admitting: Family Medicine

## 2014-11-14 ENCOUNTER — Ambulatory Visit (INDEPENDENT_AMBULATORY_CARE_PROVIDER_SITE_OTHER): Payer: BC Managed Care – PPO | Admitting: Family Medicine

## 2014-11-14 VITALS — BP 112/60 | HR 66 | Temp 98.1°F | Resp 16 | Ht 71.5 in | Wt 212.0 lb

## 2014-11-14 DIAGNOSIS — Z0000 Encounter for general adult medical examination without abnormal findings: Secondary | ICD-10-CM | POA: Diagnosis not present

## 2014-11-14 DIAGNOSIS — Z8739 Personal history of other diseases of the musculoskeletal system and connective tissue: Secondary | ICD-10-CM

## 2014-11-14 DIAGNOSIS — R351 Nocturia: Secondary | ICD-10-CM | POA: Diagnosis not present

## 2014-11-14 NOTE — Patient Instructions (Signed)

## 2014-11-14 NOTE — Progress Notes (Signed)
Patient ID: Chris Odom, male   DOB: 1955-05-01, 59 y.o.   MRN: [id number]       Patient: Chris Odom, Male    DOB: 1955-03-23, 59 y.o.   MRN: [id number] Visit Date: 11/14/2014  Today's Provider: Vernie Murders, PA   Chief Complaint  Patient presents with  . Annual Exam    last annual exam was on 11/26/12, labs were done on 04/25/2014   Subjective:    Annual physical exam Chris Odom is a 59 y.o. male who presents today for health maintenance and complete physical. He feels well. He reports exercising infrequently. He reports he is sleeping fairly well (6-7 hours a night).  -----------------------------------------------------------------  Review of Systems  Constitutional: Negative.   HENT: Positive for nosebleeds.        A couple of times last month  Eyes: Negative.   Respiratory: Negative.   Cardiovascular: Negative.   Gastrointestinal: Negative.   Endocrine: Negative.   Genitourinary: Negative.   Musculoskeletal: Negative.   Skin: Negative.   Allergic/Immunologic: Negative.   Neurological: Negative.   Hematological: Negative.   Psychiatric/Behavioral: Negative.    Social History He  reports that he quit smoking about 9 years ago. He has never used smokeless tobacco. He reports that he does not drink alcohol or use illicit drugs. Social History   Social History  . Marital Status: Married    Spouse Name: N/A  . Number of Children: N/A  . Years of Education: N/A   Social History Main Topics  . Smoking status: Former Smoker -- 2.50 packs/day for 30 years    Quit date: 06/19/2005  . Smokeless tobacco: Never Used  . Alcohol Use: No  . Drug Use: No  . Sexual Activity: Yes   Other Topics Concern  . None   Social History Narrative    Patient Active Problem List   Diagnosis Date Noted  . History of osteomyelitis 10/13/2014  . Acute osteomyelitis involving hand ([place]) 02/03/2013  . Acute osteomyelitis of hand ([place]) 02/03/2013  . Osteomyelitis  of finger ([place]) 01/06/2013    Past Surgical History  Procedure Laterality Date  . Foot neuroma surgery  10    rt  . Hand surgery  98    rt hand gangion, wrist ganglion  . Knee arthroscopy  05    ganglion cyst  . Kidney stone surgery      cysto  . Cervical fusion  01/2011    C 3 -C7  . Colonoscopy  2009    internal hemorrhoids  . Excisional hemorrhoidectomy  2014  . Incision and drainage abscess Left 12/04/2012    Procedure: INCISION AND DRAINAGE ABSCESS;  Surgeon: Tennis Must, MD;  Location: [place];  Service: Orthopedics;  Laterality: Left;  i and d np joint abcess  left thumb   . I&d extremity Left 12/22/2012    Procedure: IRRIGATION AND DEBRIDEMENT LEFT THUMB/HAND ;  Surgeon: Tennis Must, MD;  Location: [place];  Service: Orthopedics;  Laterality: Left;  . Incision and drainage abscess Left 01/05/2013    Procedure: INCISION AND DRAINAGE LEFT THUMB;  Surgeon: Tennis Must, MD;  Location: [place];  Service: Orthopedics;  Laterality: Left;  . Thumb arthroscopy  1/15    right-[facility]  . Shoulder arthroscopy with bicepstenotomy Left 12/24/2013    Procedure: SHOULDER ARTHROSCOPY WITH BICEPSTENOTOMY;  Surgeon: Renette Butters, MD;  Location: [place];  Service: Orthopedics;  Laterality: Left;  Family History  Family Status  Relation Status Death Age  . Mother Alive   . Father Deceased   . Sister Alive   . Brother Alive   . Sister Alive    His family history includes Diabetes in his father; Hypertension in his mother.    No Known Allergies  Previous Medications   DICLOFENAC SODIUM 2 % SOLN    Place 2 g onto the skin 2 (two) times daily.   MELOXICAM (MOBIC) 15 MG TABLET    Take by mouth.   OMEPRAZOLE (PRILOSEC) 20 MG CAPSULE    Take 1 capsule (20 mg total) by mouth daily.   PROBIOTIC PRODUCT (PROBIOTIC DAILY PO)    Take by mouth.    Patient Care Team: Birdie Sons, MD as PCP - General (Family  Medicine)     Objective:   Vitals: BP 112/60 mmHg  Pulse 66  Temp(Src) 98.1 F (36.7 C) (Oral)  Resp 16  Ht 5' 11.5" (1.816 m)  Wt 212 lb (96.163 kg)  BMI 29.16 kg/m2  SpO2 97%   Physical Exam  Constitutional: He is oriented to person, place, and time. He appears well-developed and well-nourished.  HENT:  Head: Normocephalic and atraumatic.  Right Ear: External ear normal.  Left Ear: External ear normal.  Nose: Nose normal.  Mouth/Throat: Oropharynx is clear and moist.  Eyes: Conjunctivae and EOM are normal. Pupils are equal, round, and reactive to light.  Neck: Normal range of motion. Neck supple.  Cardiovascular: Normal rate, regular rhythm, normal heart sounds and intact distal pulses.   Pulmonary/Chest: Effort normal and breath sounds normal.  Abdominal: Soft. Bowel sounds are normal.  Genitourinary:  Declines exam due to diarrhea from Raisin Bran cereal this morning.  Musculoskeletal: Normal range of motion.  Frozen left thumb joint from past infections and multiple I&D procedures since 11-26-12.  Neurological: He is alert and oriented to person, place, and time. He has normal reflexes.  Skin: No rash noted.  Psychiatric: He has a normal mood and affect. His behavior is normal. Judgment and thought content normal.   Depression Screen PHQ 2/9 Scores 01/11/2013 12/29/2012  PHQ - 2 Score 0 0   Assessment & Plan:     Routine Health Maintenance and Physical Exam  Exercise Activities and Dietary recommendations Goals    Recommend 20-30 minute workout 3 times a week and balance low fat diet.     Health Maintenance  Topic Date Due  . Hepatitis C Screening  09-Jan-1956  . HIV Screening  07/23/1972  . TETANUS/TDAP  07/23/1976  . COLONOSCOPY  07/24/2007  . INFLUENZA VACCINE  08/22/2014      Discussed health benefits of physical activity, and encouraged him to engage in regular exercise appropriate for his age and condition.      --------------------------------------------------------------------  1. Annual physical exam General health good. Last colonoscopy was on 06-30-08 with some internal hemorrhoids only. Will be getting a follow up colonoscopy by Dr. Allen Norris soon. Last Tdap was 08-23-08. Declines flu shot or other vaccines. Recheck labs and follow up pending reports. - CBC with Differential/Platelet - COMPLETE METABOLIC PANEL WITH GFR - Lipid panel - TSH  2. 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 Developed osteomyelitis in the left thumb that cause some bone destruction and eventual fusion of the left thumb joints. Has plans for Dr. Astrid Divine [facility]  Surgeon) to reconstruct the left thumb in January 2017.   3. Nocturia Gets up a couple times a night. No hematuria, diminished stream, hesitancy or dribbling. Will check PSA. Refuses DRE today because of loose stools from Raisin Bran cereal today. Recheck pending report. (May need urology referral). - PSA

## 2014-11-17 LAB — PSA: PSA: 0.9

## 2014-11-18 LAB — COMPREHENSIVE METABOLIC PANEL
ALBUMIN: 4.7 g/dL (ref 3.5–5.5)
ALK PHOS: 75 IU/L (ref 39–117)
ALT: 27 IU/L (ref 0–44)
AST: 28 IU/L (ref 0–40)
Albumin/Globulin Ratio: 1.8 (ref 1.1–2.5)
BUN / CREAT RATIO: 16 (ref 9–20)
BUN: 16 mg/dL (ref 6–24)
Bilirubin Total: 0.7 mg/dL (ref 0.0–1.2)
CO2: 25 mmol/L (ref 18–29)
CREATININE: 1 mg/dL (ref 0.76–1.27)
Calcium: 10 mg/dL (ref 8.7–10.2)
Chloride: 98 mmol/L (ref 97–106)
GFR calc Af Amer: 96 mL/min/{1.73_m2} (ref >59)
GFR calc non Af Amer: 83 mL/min/{1.73_m2} (ref >59)
GLOBULIN, TOTAL: 2.6 g/dL (ref 1.5–4.5)
Glucose: 102 mg/dL — ABNORMAL HIGH (ref 65–99)
Potassium: 4.4 mmol/L (ref 3.5–5.2)
SODIUM: 138 mmol/L (ref 136–144)
TOTAL PROTEIN: 7.3 g/dL (ref 6.0–8.5)

## 2014-11-18 LAB — LIPID PANEL
CHOL/HDL RATIO: 3.7 ratio (ref 0.0–5.0)
CHOLESTEROL TOTAL: 237 mg/dL — AB (ref 100–199)
HDL: 64 mg/dL (ref >39)
LDL CALC: 158 mg/dL — AB (ref 0–99)
TRIGLYCERIDES: 73 mg/dL (ref 0–149)
VLDL Cholesterol Cal: 15 mg/dL (ref 5–40)

## 2014-11-18 LAB — CBC WITH DIFFERENTIAL/PLATELET
BASOS: 0 %
Basophils Absolute: 0 10*3/uL (ref 0.0–0.2)
EOS (ABSOLUTE): 0.1 10*3/uL (ref 0.0–0.4)
Eos: 2 %
HEMATOCRIT: 47 % (ref 37.5–51.0)
HEMOGLOBIN: 16 g/dL (ref 12.6–17.7)
IMMATURE GRANULOCYTES: 0 %
Immature Grans (Abs): 0 10*3/uL (ref 0.0–0.1)
LYMPHS: 41 %
Lymphocytes Absolute: 2 10*3/uL (ref 0.7–3.1)
MCH: 30.5 pg (ref 26.6–33.0)
MCHC: 34 g/dL (ref 31.5–35.7)
MCV: 90 fL (ref 79–97)
MONOCYTES: 7 %
Monocytes Absolute: 0.4 10*3/uL (ref 0.1–0.9)
NEUTROS ABS: 2.4 10*3/uL (ref 1.4–7.0)
Neutrophils: 50 %
Platelets: 255 10*3/uL (ref 150–379)
RBC: 5.25 x10E6/uL (ref 4.14–5.80)
RDW: 14.2 % (ref 12.3–15.4)
WBC: 4.9 10*3/uL (ref 3.4–10.8)

## 2014-11-18 LAB — TSH: TSH: 1.21 u[IU]/mL (ref 0.450–4.500)

## 2014-11-18 LAB — PSA: Prostate Specific Ag, Serum: 0.9 ng/mL (ref 0.0–4.0)

## 2014-11-21 ENCOUNTER — Telehealth: Payer: Self-pay

## 2014-11-21 NOTE — Telephone Encounter (Signed)
Patient advised as directed below. Patient verbalized understanding and agrees with treatment plan. Patient will call back to schedule a follow up appointment.   

## 2014-11-21 NOTE — Telephone Encounter (Signed)
-----   Message from [place], [place] sent at 11/18/2014  4:01 PM EDT ----- All blood tests essentially normal except total cholesterol and LDL cholesterol high. Recommend low fat diet, regular exercise and may try Krill Oil one daily with Metamucil 1 teaspoon in 4-6 ounces of water or juice BID. Recheck progress in 3 months to see if a cholesterol medication is needed.

## 2014-11-30 ENCOUNTER — Encounter: Payer: Self-pay | Admitting: Gastroenterology

## 2014-11-30 ENCOUNTER — Ambulatory Visit (INDEPENDENT_AMBULATORY_CARE_PROVIDER_SITE_OTHER): Payer: BC Managed Care – PPO | Admitting: Gastroenterology

## 2014-11-30 VITALS — BP 127/79 | HR 59 | Temp 97.9°F | Ht 71.5 in | Wt 210.0 lb

## 2014-11-30 DIAGNOSIS — K219 Gastro-esophageal reflux disease without esophagitis: Secondary | ICD-10-CM

## 2014-11-30 NOTE — Progress Notes (Signed)
Gastroenterology Consultation  Referring Provider:     Birdie Sons, MD Primary Care Physician:  Lelon Huh, MD Primary Gastroenterologist:  Dr. Allen Norris     Reason for Consultation:     Jerrye Bushy        HPI:   Chris Odom is a 59 y.o. y/o male referred for consultation & management of  Gerd by Dr. Lelon Huh, MD.   This patient comes today with a history of Gerd. The patient reports that he has had his symptoms for some time and is presently taking Omeprazole. The patient was also recently on Meloxicam. This may his symptoms worse. He denies ever undergoing an upper endoscopy to evaluate his esophagus for possible Barrett's esophagus. There is no report of any unexplained weight loss, fevers, chills, nausea or vomiting. There is also no report of any black stools or bloody stools. He denies any abdominal pain.  Past Medical History  Diagnosis Date  . Chronic kidney disease     hx stones  . GERD (gastroesophageal reflux disease)   . COPD (chronic obstructive pulmonary disease) ([facility])     a little -due to smoking- no problems recently  . Angina   . Wears glasses     Past Surgical History  Procedure Laterality Date  . Foot neuroma surgery  10    rt  . Hand surgery  98    rt hand gangion, wrist ganglion  . Knee arthroscopy  05    ganglion cyst  . Kidney stone surgery      cysto  . Cervical fusion  01/2011    C 3 -C7  . Colonoscopy  2009    internal hemorrhoids  . Excisional hemorrhoidectomy  2014  . Incision and drainage abscess Left 12/04/2012    Procedure: INCISION AND DRAINAGE ABSCESS;  Surgeon: Tennis Must, MD;  Location: [place];  Service: Orthopedics;  Laterality: Left;  i and d np joint abcess  left thumb   . I&d extremity Left 12/22/2012    Procedure: IRRIGATION AND DEBRIDEMENT LEFT THUMB/HAND ;  Surgeon: Tennis Must, MD;  Location: [place];  Service: Orthopedics;  Laterality: Left;  . Incision and drainage abscess Left  01/05/2013    Procedure: INCISION AND DRAINAGE LEFT THUMB;  Surgeon: Tennis Must, MD;  Location: [place];  Service: Orthopedics;  Laterality: Left;  . Thumb arthroscopy  1/15    right-[facility]  . Shoulder arthroscopy with bicepstenotomy Left 12/24/2013    Procedure: SHOULDER ARTHROSCOPY WITH BICEPSTENOTOMY;  Surgeon: Renette Butters, MD;  Location: [place];  Service: Orthopedics;  Laterality: Left;    Prior to Admission medications   Medication Sig Start Date End Date Taking? Authorizing Provider  Diclofenac Sodium 2 % SOLN Place 2 g onto the skin 2 (two) times daily. 10/25/14  Yes Dennis E Chrismon, PA  omeprazole (PRILOSEC) 20 MG capsule Take 1 capsule (20 mg total) by mouth daily. 10/13/14  Yes Dennis E Chrismon, PA  meloxicam (MOBIC) 15 MG tablet Take by mouth.    Historical Provider, MD  Probiotic Product (PROBIOTIC DAILY PO) Take by mouth.    Historical Provider, MD    Family History  Problem Relation Age of Onset  . Hypertension Mother   . Diabetes Father      Social History  Substance Use Topics  . Smoking status: Former Smoker -- 2.50 packs/day for 30 years    Quit date: 06/19/2005  . Smokeless  tobacco: Never Used  . Alcohol Use: No    Allergies as of 11/30/2014  . (No Known Allergies)    Review of Systems:    All systems reviewed and negative except where noted in HPI.   Physical Exam:  BP 127/79 mmHg  Pulse 59  Temp(Src) 97.9 F (36.6 C) (Oral)  Ht 5' 11.5" (1.816 m)  Wt 210 lb (95.255 kg)  BMI 28.88 kg/m2 No LMP for male patient. Psych:  Alert and cooperative. Normal mood and affect. General:   Alert,  Well-developed, well-nourished, pleasant and cooperative in NAD Head:  Normocephalic and atraumatic. Eyes:  Sclera clear, no icterus.   Conjunctiva pink. Ears:  Normal auditory acuity. Nose:  No deformity, discharge, or lesions. Mouth:  No deformity or lesions,oropharynx pink & moist. Neck:  Supple; no masses or  thyromegaly. Lungs:  Respirations even and unlabored.  Clear throughout to auscultation.   No wheezes, crackles, or rhonchi. No acute distress. Heart:  Regular rate and rhythm; no murmurs, clicks, rubs, or gallops. Abdomen:  Normal bowel sounds.  No bruits.  Soft, non-tender and non-distended without masses, hepatosplenomegaly or hernias noted.  No guarding or rebound tenderness.  Negative Carnett sign.   Rectal:  Deferred.  Msk:  Symmetrical without gross deformities.  Good, equal movement & strength bilaterally. Pulses:  Normal pulses noted. Extremities:  No clubbing or edema.  No cyanosis. Neurologic:  Alert and oriented x3;  grossly normal neurologically. Skin:  Intact without significant lesions or rashes.  No jaundice. Lymph Nodes:  No significant cervical adenopathy. Psych:  Alert and cooperative. Normal mood and affect.  Imaging Studies: No results found.  Assessment and Plan:   Chris Odom is a 59 y.o. y/o male  Who has a history of chronic heartburn. The patient will be set up for an upper endoscopy to evaluate his esophagus for possible Barrett's esophagus. The patient had worsening of his symptoms while taking meloxicam but his symptoms have decreased since he is off of that. The patient has been explained the plan and agrees with it. I have discussed risks & benefits which include, but are not limited to, bleeding, infection, perforation & drug reaction.  The patient agrees with this plan & written consent will be obtained.      Note: This dictation was prepared with Dragon dictation along with smaller phrase technology. Any transcriptional errors that result from this process are unintentional.

## 2014-12-05 ENCOUNTER — Other Ambulatory Visit: Payer: Self-pay

## 2015-01-26 HISTORY — PX: THUMB FUSION: SUR636

## 2015-02-01 ENCOUNTER — Encounter: Payer: Self-pay | Admitting: *Deleted

## 2015-03-07 ENCOUNTER — Encounter: Payer: Self-pay | Admitting: *Deleted

## 2015-03-16 NOTE — Discharge Instructions (Signed)

## 2015-03-17 ENCOUNTER — Encounter
Admission: RE | Disposition: A | Discharge status: Home / Self Care | Payer: Self-pay | Source: Ambulatory Visit | Attending: Gastroenterology

## 2015-03-17 ENCOUNTER — Ambulatory Visit: Payer: BC Managed Care – PPO | Admitting: Anesthesiology

## 2015-03-17 ENCOUNTER — Ambulatory Visit
Admission: RE | Admit: 2015-03-17 | Discharge: 2015-03-17 | Disposition: A | Discharge status: Home / Self Care | Payer: BC Managed Care – PPO | Source: Ambulatory Visit | Attending: Gastroenterology | Admitting: Gastroenterology

## 2015-03-17 ENCOUNTER — Other Ambulatory Visit: Payer: Self-pay | Admitting: Gastroenterology

## 2015-03-17 DIAGNOSIS — J449 Chronic obstructive pulmonary disease, unspecified: Secondary | ICD-10-CM | POA: Diagnosis not present

## 2015-03-17 DIAGNOSIS — N189 Chronic kidney disease, unspecified: Secondary | ICD-10-CM | POA: Diagnosis not present

## 2015-03-17 DIAGNOSIS — Z87891 Personal history of nicotine dependence: Secondary | ICD-10-CM | POA: Diagnosis not present

## 2015-03-17 DIAGNOSIS — K2100 Gastro-esophageal reflux disease with esophagitis, without bleeding: Secondary | ICD-10-CM | POA: Insufficient documentation

## 2015-03-17 DIAGNOSIS — Z87442 Personal history of urinary calculi: Secondary | ICD-10-CM | POA: Insufficient documentation

## 2015-03-17 DIAGNOSIS — Z981 Arthrodesis status: Secondary | ICD-10-CM | POA: Diagnosis not present

## 2015-03-17 DIAGNOSIS — Z79899 Other long term (current) drug therapy: Secondary | ICD-10-CM | POA: Insufficient documentation

## 2015-03-17 DIAGNOSIS — K2970 Gastritis, unspecified, without bleeding: Secondary | ICD-10-CM | POA: Diagnosis not present

## 2015-03-17 DIAGNOSIS — R12 Heartburn: Secondary | ICD-10-CM

## 2015-03-17 HISTORY — PX: ESOPHAGOGASTRODUODENOSCOPY (EGD) WITH PROPOFOL: SHX5813

## 2015-03-17 SURGERY — ESOPHAGOGASTRODUODENOSCOPY (EGD) WITH PROPOFOL
Anesthesia: Monitor Anesthesia Care

## 2015-03-17 MED ORDER — LACTATED RINGERS IV SOLN
INTRAVENOUS | Status: DC
  Administered 2015-03-17: 09:00:00 via INTRAVENOUS

## 2015-03-17 MED ORDER — PROMETHAZINE HCL 25 MG/ML IJ SOLN
6.2500 mg | INTRAMUSCULAR | Status: DC | PRN
Start: 2015-03-17 — End: 2015-03-17

## 2015-03-17 MED ORDER — LIDOCAINE HCL (CARDIAC) 20 MG/ML IV SOLN
INTRAVENOUS | Status: DC | PRN
  Administered 2015-03-17: 50 mg via INTRAVENOUS

## 2015-03-17 MED ORDER — OXYCODONE HCL 5 MG/5ML PO SOLN: 5.0000 mg | Freq: Once | ORAL | Status: DC | PRN

## 2015-03-17 MED ORDER — OXYCODONE HCL 5 MG PO TABS: 5.0000 mg | ORAL_TABLET | Freq: Once | ORAL | Status: DC | PRN

## 2015-03-17 MED ORDER — FENTANYL CITRATE (PF) 100 MCG/2ML IJ SOLN: 25.0000 ug | INTRAMUSCULAR | Status: DC | PRN

## 2015-03-17 MED ORDER — STERILE WATER FOR IRRIGATION IR SOLN
Status: DC | PRN
  Administered 2015-03-17: 09:00:00

## 2015-03-17 MED ORDER — PROPOFOL 10 MG/ML IV BOLUS
INTRAVENOUS | Status: DC | PRN
  Administered 2015-03-17: 100 mg via INTRAVENOUS
  Administered 2015-03-17: 50 mg via INTRAVENOUS

## 2015-03-17 MED ORDER — SODIUM CHLORIDE 0.9 % IV SOLN: INTRAVENOUS | Status: DC

## 2015-03-17 MED ORDER — PANTOPRAZOLE SODIUM 40 MG PO TBEC: 40.0000 mg | DELAYED_RELEASE_TABLET | Freq: Every day | ORAL | Status: DC

## 2015-03-17 MED ORDER — SODIUM CHLORIDE 0.9 % IV SOLN: INTRAVENOUS | Status: AC

## 2015-03-17 MED ORDER — GLYCOPYRROLATE 0.2 MG/ML IJ SOLN
INTRAMUSCULAR | Status: DC | PRN
  Administered 2015-03-17: 0.1 mg via INTRAVENOUS

## 2015-03-17 SURGICAL SUPPLY — 39 items
BALLN DILATOR 10-12 8 (BALLOONS)
BALLN DILATOR 12-15 8 (BALLOONS)
BALLN DILATOR 15-18 8 (BALLOONS)
BALLN DILATOR CRE 0-12 8 (BALLOONS)
BALLN DILATOR ESOPH 8 10 CRE (MISCELLANEOUS) IMPLANT
BALLOON DILATOR 12-15 8 (BALLOONS) IMPLANT
BALLOON DILATOR 15-18 8 (BALLOONS) IMPLANT
BALLOON DILATOR CRE 0-12 8 (BALLOONS) IMPLANT
BLOCK BITE 60FR ADLT L/F GRN (MISCELLANEOUS) ×3 IMPLANT
CANISTER SUCT 1200ML W/VALVE (MISCELLANEOUS) ×3 IMPLANT
FCP ESCP3.2XJMB 240X2.8X (MISCELLANEOUS) ×1
FORCEPS BIOP RAD 4 LRG CAP 4 (CUTTING FORCEPS) IMPLANT
FORCEPS BIOP RJ4 240 W/NDL (MISCELLANEOUS) ×2
FORCEPS ESCP3.2XJMB 240X2.8X (MISCELLANEOUS) ×1 IMPLANT
GOWN CVR UNV OPN BCK APRN NK (MISCELLANEOUS) ×2 IMPLANT
GOWN ISOL THUMB LOOP REG UNIV (MISCELLANEOUS) ×4
HEMOCLIP INSTINCT (CLIP) IMPLANT
INJECTOR VARIJECT VIN23 (MISCELLANEOUS) IMPLANT
KIT CO2 TUBING (TUBING) IMPLANT
KIT DEFENDO VALVE AND CONN (KITS) IMPLANT
KIT ENDO PROCEDURE OLY (KITS) ×3 IMPLANT
LIGATOR MULTIBAND 6SHOOTER MBL (MISCELLANEOUS) IMPLANT
MARKER SPOT ENDO TATTOO 5ML (MISCELLANEOUS) IMPLANT
PAD GROUND ADULT SPLIT (MISCELLANEOUS) IMPLANT
SNARE SHORT THROW 13M SML OVAL (MISCELLANEOUS) IMPLANT
SNARE SHORT THROW 30M LRG OVAL (MISCELLANEOUS) IMPLANT
SPOT EX ENDOSCOPIC TATTOO (MISCELLANEOUS)
SUCTION POLY TRAP 4CHAMBER (MISCELLANEOUS) IMPLANT
SYR INFLATION 60ML (SYRINGE) IMPLANT
TRAP SUCTION POLY (MISCELLANEOUS) IMPLANT
TUBING CONN 6MMX3.1M (TUBING)
TUBING SUCTION CONN 0.25 STRL (TUBING) IMPLANT
UNDERPAD 30X60 958B10 (PK) (MISCELLANEOUS) IMPLANT
VALVE BIOPSY ENDO (VALVE) IMPLANT
VARIJECT INJECTOR VIN23 (MISCELLANEOUS)
WATER AUXILLARY (MISCELLANEOUS) IMPLANT
WATER STERILE IRR 250ML POUR (IV SOLUTION) ×3 IMPLANT
WATER STERILE IRR 500ML POUR (IV SOLUTION) IMPLANT
WIRE CRE 18-20MM 8CM F G (MISCELLANEOUS) IMPLANT

## 2015-03-17 NOTE — Anesthesia Procedure Notes (Signed)
Procedure Name: MAC Performed by: Paulette Lynch Pre-anesthesia Checklist: Patient identified, Emergency Drugs available, Suction available, Timeout performed and Patient being monitored Patient Re-evaluated:Patient Re-evaluated prior to inductionOxygen Delivery Method: Nasal cannula Placement Confirmation: positive ETCO2     

## 2015-03-17 NOTE — Addendum Note (Signed)
Addendum  created 03/17/15 1152 by Shyrl Numbers, MD   Modules edited: Clinical Notes   Clinical Notes:  File: [id number]

## 2015-03-17 NOTE — Op Note (Signed)
[facility] Gastroenterology Patient Name: Chris Odom Procedure Date: 03/17/2015 8:47 AM MRN: [id number] Account #: [id number] Date of Birth: July 13, 1955 Admit Type: Outpatient Age: 60 Room: [facility] OR ROOM 01 Gender: Male Note Status: Finalized Procedure:            Upper GI endoscopy Indications:          Heartburn Providers:            Lucilla Lame, MD Referring MD:         Kirstie Peri. Caryn Section, MD (Referring MD) Medicines:            Propofol per Anesthesia Complications:        No immediate complications. Procedure:            Pre-Anesthesia Assessment:                       - Prior to the procedure, a History and Physical was                        performed, and patient medications and allergies were                        reviewed. The patient's tolerance of previous                        anesthesia was also reviewed. The risks and benefits of                        the procedure and the sedation options and risks were                        discussed with the patient. All questions were                        answered, and informed consent was obtained. Prior                        Anticoagulants: The patient has taken no previous                        anticoagulant or antiplatelet agents. ASA Grade                        Assessment: II - A patient with mild systemic disease.                        After reviewing the risks and benefits, the patient was                        deemed in satisfactory condition to undergo the                        procedure.                       After obtaining informed consent, the endoscope was                        passed under direct vision. Throughout the procedure,  the patient's blood pressure, pulse, and oxygen                        saturations were monitored continuously. The Olympus                        GIF H180J colonscope [id number]) was introduced                        through the  mouth, and advanced to the second part of                        duodenum. The upper GI endoscopy was accomplished                        without difficulty. The patient tolerated the procedure                        well. Findings:      LA Grade A (one or more mucosal breaks less than 5 mm, not extending       between tops of 2 mucosal folds) esophagitis with no bleeding was found       in the lower third of the esophagus.      Localized moderate inflammation characterized by erosions was found in       the gastric antrum. Biopsies were taken with a cold forceps for       histology.      The examined duodenum was normal. Impression:           - LA Grade A reflux esophagitis.                       - Gastritis. Biopsied.                       - Normal examined duodenum. Recommendation:       - Await pathology results.                       - Use a proton pump inhibitor PO daily. Procedure Code(s):    --- Professional ---                       [phone], Esophagogastroduodenoscopy, flexible, transoral;                        with biopsy, single or multiple Diagnosis Code(s):    --- Professional ---                       R12, Heartburn                       K21.0, Gastro-esophageal reflux disease with esophagitis                       K29.70, Gastritis, unspecified, without bleeding CPT copyright 2016 American Medical Association. All rights reserved. The codes documented in this report are preliminary and upon coder review may  be revised to meet current compliance requirements. Lucilla Lame, MD 03/17/2015 9:03:19 AM This report has been signed electronically. Number of Addenda: 0 Note Initiated On: 03/17/2015 8:47 AM Total Procedure Duration: 0 hours 2 minutes 35  seconds       [facility]

## 2015-03-17 NOTE — Anesthesia Postprocedure Evaluation (Addendum)
Anesthesia Post Note  Patient: Chris Odom  Procedure(s) Performed: Procedure(s) (LRB): ESOPHAGOGASTRODUODENOSCOPY (EGD) WITH PROPOFOL (N/A)  Patient location during evaluation: PACU Anesthesia Type: MAC Level of consciousness: awake and alert Pain management: pain level controlled Vital Signs Assessment: post-procedure vital signs reviewed and stable Respiratory status: spontaneous breathing, nonlabored ventilation, respiratory function stable and patient connected to nasal cannula oxygen Cardiovascular status: blood pressure returned to baseline and stable Postop Assessment: no signs of nausea or vomiting Anesthetic complications: no    Amayiah Gosnell C

## 2015-03-17 NOTE — Transfer of Care (Signed)
Immediate Anesthesia Transfer of Care Note  Patient: Chris Odom  Procedure(s) Performed: Procedure(s): ESOPHAGOGASTRODUODENOSCOPY (EGD) WITH PROPOFOL (N/A)  Patient Location: PACU  Anesthesia Type: MAC  Level of Consciousness: awake, alert  and patient cooperative  Airway and Oxygen Therapy: Patient Spontanous Breathing and Patient connected to supplemental oxygen  Post-op Assessment: Post-op Vital signs reviewed, Patient's Cardiovascular Status Stable, Respiratory Function Stable, Patent Airway and No signs of Nausea or vomiting  Post-op Vital Signs: Reviewed and stable  Complications: No apparent anesthesia complications

## 2015-03-17 NOTE — Anesthesia Preprocedure Evaluation (Addendum)
Anesthesia Evaluation  Patient identified by MRN, date of birth, ID band Patient awake    Reviewed: Allergy & Precautions, NPO status , Patient's Chart, lab work & pertinent test results  Airway Mallampati: II  TM Distance: >3 FB Neck ROM: Full    Dental no notable dental hx.    Pulmonary COPD, former smoker,    Pulmonary exam normal breath sounds clear to auscultation       Cardiovascular Exercise Tolerance: Good METS: 3 - Mets negative cardio ROS Normal cardiovascular exam Rhythm:Regular Rate:Normal     Neuro/Psych negative neurological ROS  negative psych ROS   GI/Hepatic Neg liver ROS, GERD  Medicated and Controlled,  Endo/Other  negative endocrine ROS  Renal/GU Renal disease  negative genitourinary   Musculoskeletal negative musculoskeletal ROS (+)   Abdominal   Peds negative pediatric ROS (+)  Hematology negative hematology ROS (+)   Anesthesia Other Findings   Reproductive/Obstetrics negative OB ROS                            Anesthesia Physical Anesthesia Plan  ASA: III  Anesthesia Plan: MAC   Post-op Pain Management:    Induction: Intravenous  Airway Management Planned: Natural Airway  Additional Equipment:   Intra-op Plan:   Post-operative Plan: Extubation in OR  Informed Consent: I have reviewed the patients History and Physical, chart, labs and discussed the procedure including the risks, benefits and alternatives for the proposed anesthesia with the patient or authorized representative who has indicated his/her understanding and acceptance.   Dental advisory given  Plan Discussed with: CRNA  Anesthesia Plan Comments:         Anesthesia Quick Evaluation

## 2015-03-17 NOTE — H&P (Signed)
[facility]  [address] [place], [place] [address] Phone: [phone] Fax : [phone]  Primary Care Physician:  Lelon Huh, MD Primary Gastroenterologist:  Dr. Allen Norris  Pre-Procedure History & Physical: HPI:  SIDNEY KOSAK is a 60 y.o. male is here for an endoscopy.   Past Medical History  Diagnosis Date  . Chronic kidney disease     hx stones  . GERD (gastroesophageal reflux disease)   . COPD (chronic obstructive pulmonary disease) (HCC)     a little -due to smoking- no problems recently  . Wears glasses     Past Surgical History  Procedure Laterality Date  . Foot neuroma surgery  10    rt  . Hand surgery  98    rt hand gangion, wrist ganglion  . Knee arthroscopy  05    ganglion cyst  . Kidney stone surgery      cysto  . Cervical fusion  01/2011    C 3 -C7  . Colonoscopy  2009    internal hemorrhoids  . Excisional hemorrhoidectomy  2014  . Incision and drainage abscess Left 12/04/2012    Procedure: INCISION AND DRAINAGE ABSCESS;  Surgeon: Tennis Must, MD;  Location: [place];  Service: Orthopedics;  Laterality: Left;  i and d np joint abcess  left thumb   . I&d extremity Left 12/22/2012    Procedure: IRRIGATION AND DEBRIDEMENT LEFT THUMB/HAND ;  Surgeon: Tennis Must, MD;  Location: [place];  Service: Orthopedics;  Laterality: Left;  . Incision and drainage abscess Left 01/05/2013    Procedure: INCISION AND DRAINAGE LEFT THUMB;  Surgeon: Tennis Must, MD;  Location: [place];  Service: Orthopedics;  Laterality: Left;  . Thumb arthroscopy  1/15    right-[facility]  . Shoulder arthroscopy with bicepstenotomy Left 12/24/2013    Procedure: SHOULDER ARTHROSCOPY WITH BICEPSTENOTOMY;  Surgeon: Renette Butters, MD;  Location: [place];  Service: Orthopedics;  Laterality: Left;  . Thumb fusion Left 01/26/15    [facility]    Prior to Admission medications   Medication Sig  Start Date End Date Taking? Authorizing Provider  omeprazole (PRILOSEC) 20 MG capsule Take 1 capsule (20 mg total) by mouth daily. 10/13/14  Yes [place], [place]    Allergies as of 12/05/2014  . (No Known Allergies)    Family History  Problem Relation Age of Onset  . Hypertension Mother   . Diabetes Mother   . Diabetes Father     Social History   Social History  . Marital Status: Married    Spouse Name: N/A  . Number of Children: N/A  . Years of Education: N/A   Occupational History  . Not on file.   Social History Main Topics  . Smoking status: Former Smoker -- 2.50 packs/day for 30 years    Quit date: 06/19/2005  . Smokeless tobacco: Never Used  . Alcohol Use: No  . Drug Use: No  . Sexual Activity: Yes   Other Topics Concern  . Not on file   Social History Narrative    Review of Systems: See HPI, otherwise negative ROS  Physical Exam: Ht 5' 11.5" (1.816 m)  Wt 200 lb (90.719 kg)  BMI 27.51 kg/m2 General:   Alert,  pleasant and cooperative in NAD Head:  Normocephalic and atraumatic. Neck:  Supple; no masses or thyromegaly. Lungs:  Clear throughout to auscultation.    Heart:  Regular rate and  rhythm. Abdomen:  Soft, nontender and nondistended. Normal bowel sounds, without guarding, and without rebound.   Neurologic:  Alert and  oriented x4;  grossly normal neurologically.  Impression/Plan: FENWAY CARRICO is here for an endoscopy to be performed for GERD  Risks, benefits, limitations, and alternatives regarding  endoscopy have been reviewed with the patient.  Questions have been answered.  All parties agreeable.   Ollen Bowl, MD  03/17/2015, 8:19 AM

## 2015-03-20 ENCOUNTER — Encounter: Payer: Self-pay | Admitting: Gastroenterology

## 2015-06-23 ENCOUNTER — Encounter: Payer: Self-pay | Admitting: Family Medicine

## 2015-06-23 ENCOUNTER — Ambulatory Visit (INDEPENDENT_AMBULATORY_CARE_PROVIDER_SITE_OTHER): Payer: BC Managed Care – PPO | Admitting: Family Medicine

## 2015-06-23 VITALS — BP 130/76 | HR 66 | Temp 98.2°F | Resp 16 | Wt 213.0 lb

## 2015-06-23 DIAGNOSIS — S6991XA Unspecified injury of right wrist, hand and finger(s), initial encounter: Secondary | ICD-10-CM

## 2015-06-23 DIAGNOSIS — H6983 Other specified disorders of Eustachian tube, bilateral: Secondary | ICD-10-CM

## 2015-06-23 NOTE — Progress Notes (Signed)
Subjective:     Patient ID: Chris Odom, male   DOB: 1955/10/24, 60 y.o.   MRN: [id number]  HPI  Chief Complaint  Patient presents with  . Ear Problem    Patient comes in office today to address head congestion and fullness in both his ears for the past week. Associated with ear fullness patient reports feeling off balance.   . Hand Injury    Patient reports that about 2 weeks ago he was playing golf when he swung club to and smacked it on ground causing club to fly out his hand. Patient reports swelling of right hand   States he has not been diagnosed with allergies but is having watery sinus congestion in addition to feeling like his "head is in a barrel." Has tried Zyrtec D with little improvement. Also states right index MCP joint continues to be tender though swelling has abated.   Review of Systems     Objective:   Physical Exam  Constitutional: He appears well-developed and well-nourished. No distress.  HENT:  Both T.M's are dull in appearance with diminished light reflex.  Musculoskeletal:  Right second MCP is mildly swollen and tender on palpation. No deformity but is able to flex and extend with report of "tightness".       Assessment:    1. Eustachian tube dysfunction, bilateral  2. Injury of right index finger, initial encounter: probable sprain; r/o avulsion fracture     Plan:    Discussed use of Flonase and Aleve. He will get x-ray through his wife's employer-rx provided.

## 2015-06-23 NOTE — Patient Instructions (Addendum)
Resume Flonase spray-two squirts each nostril daily. It will take several day to get the maximum effect. Start two Aleve twice daily with food. We will provide you with the x-ray order.

## 2015-06-28 ENCOUNTER — Encounter: Payer: Self-pay | Admitting: Family Medicine

## 2015-06-29 ENCOUNTER — Telehealth: Payer: Self-pay | Admitting: Family Medicine

## 2015-06-29 NOTE — Telephone Encounter (Signed)
Pt states that is is having worsening pain and swelling in right hand.He is requesting referral to specialist.Phone # in chart is correct

## 2015-06-30 ENCOUNTER — Other Ambulatory Visit: Payer: Self-pay | Admitting: Family Medicine

## 2015-06-30 DIAGNOSIS — S6991XA Unspecified injury of right wrist, hand and finger(s), initial encounter: Secondary | ICD-10-CM

## 2015-06-30 NOTE — Telephone Encounter (Signed)
Referral in progress. 

## 2015-06-30 NOTE — Telephone Encounter (Signed)
Please review. KW 

## 2015-08-03 ENCOUNTER — Other Ambulatory Visit: Payer: Self-pay | Admitting: Family Medicine

## 2015-09-20 ENCOUNTER — Other Ambulatory Visit: Payer: Self-pay | Admitting: Family Medicine

## 2015-10-20 IMAGING — CR DG CHEST 2V
1 series · 2 of 2 positions shown · non-contrast
Comparison: None

CLINICAL DATA: Left posterior chest pain, shortness of breath

EXAM:
CHEST  2 VIEW

[Series 1: pa · 0.17mm/px · 2 of 2 slices shown]
[im 1/2]
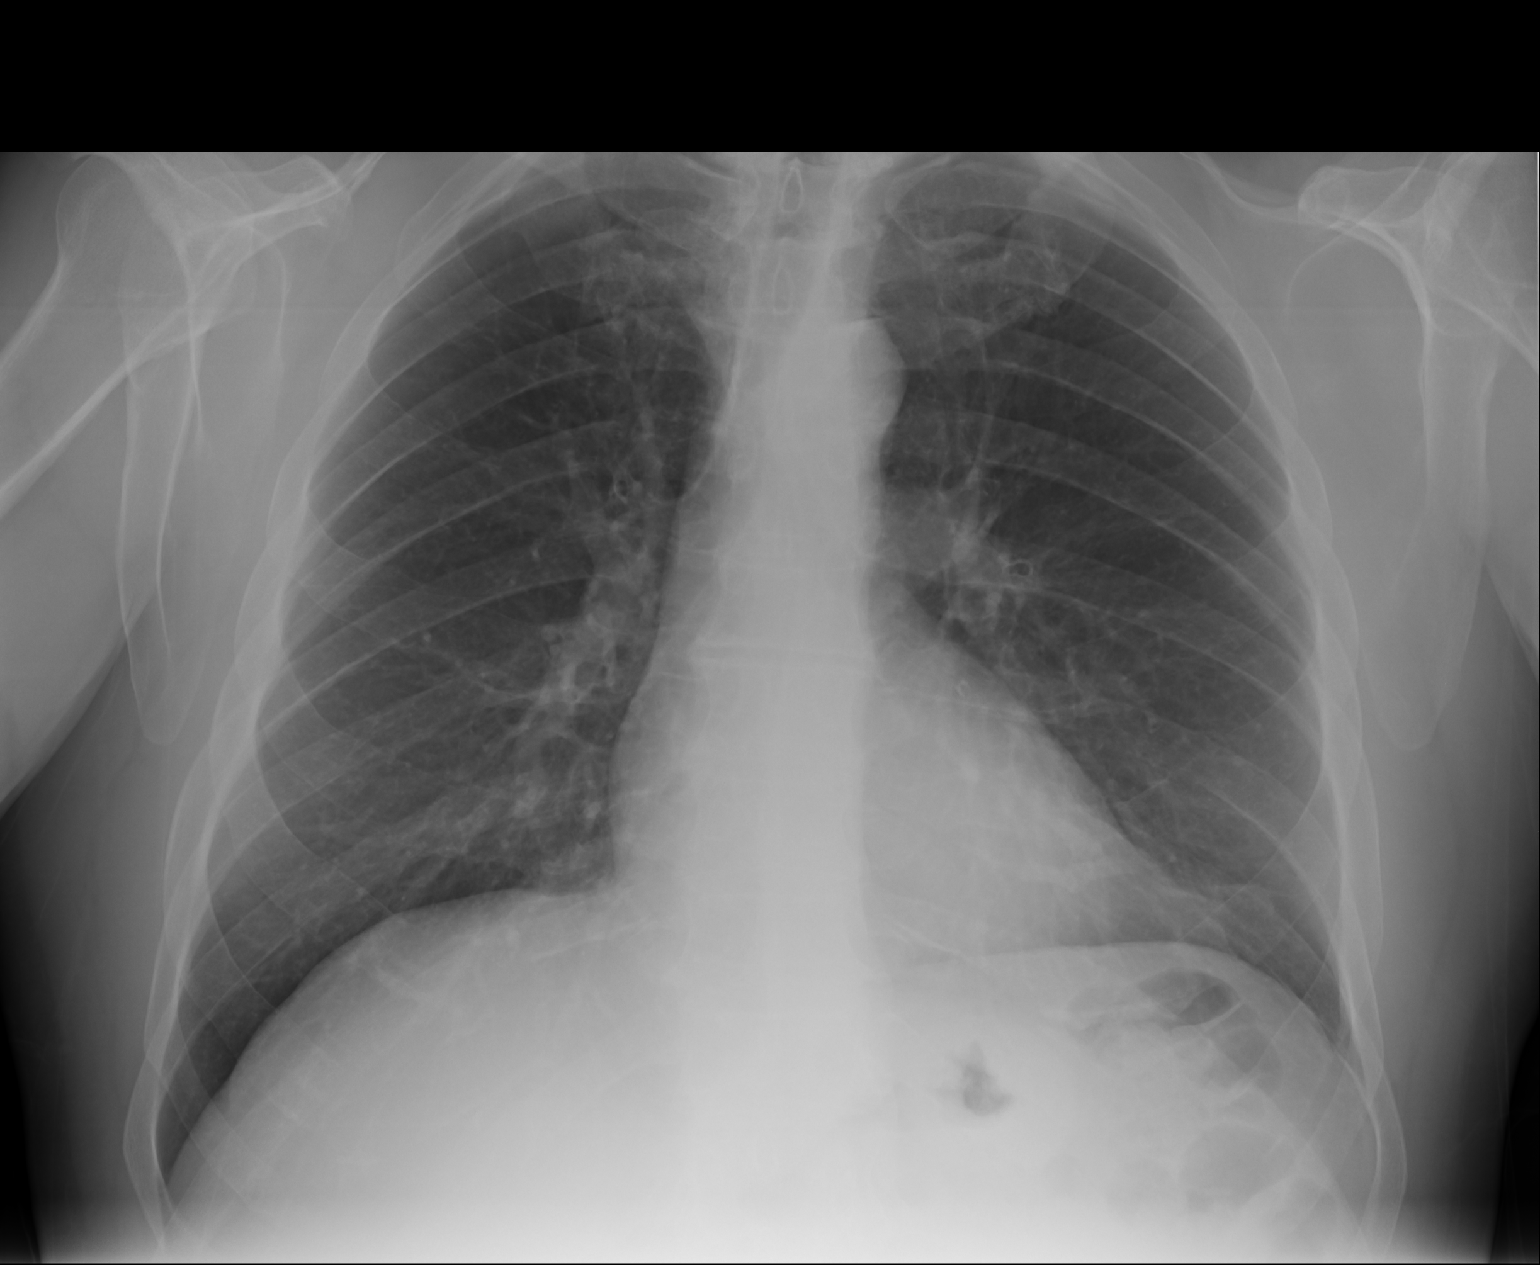
[im 2/2]
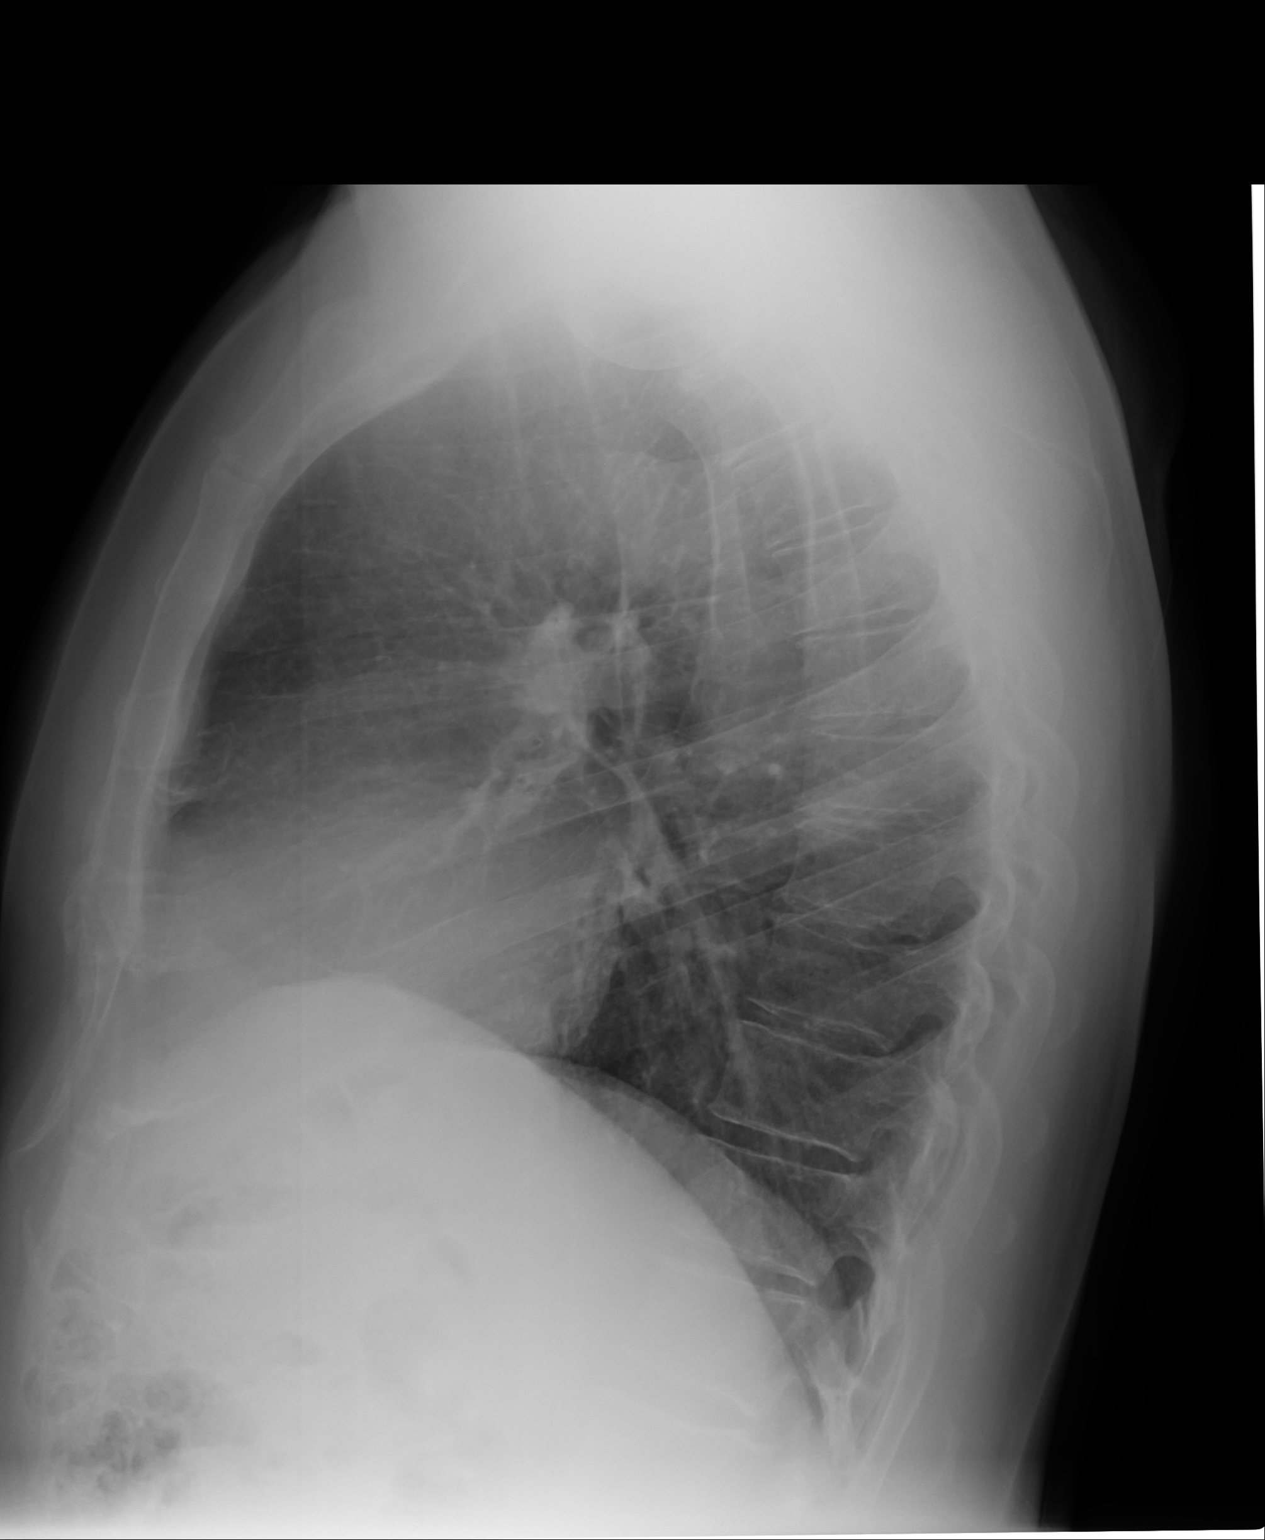

[2 of 2 positions shown; findings below may reference images not displayed]

FINDINGS: Normal heart size, mediastinal contours, and pulmonary vascularity.

Peribronchial thickening.

No infiltrate, pleural effusion or pneumothorax.

Prior cervical spine fusion.

Bones appear slightly demineralized without focal abnormality.
IMPRESSION: Minimal bronchitic changes.

## 2016-05-21 ENCOUNTER — Other Ambulatory Visit: Payer: Self-pay | Admitting: Gastroenterology

## 2016-05-31 ENCOUNTER — Other Ambulatory Visit: Payer: Self-pay

## 2016-05-31 MED ORDER — PANTOPRAZOLE SODIUM 40 MG PO TBEC: 40.0000 mg | DELAYED_RELEASE_TABLET | Freq: Every day | ORAL | 6 refills | Status: DC

## 2016-08-06 ENCOUNTER — Ambulatory Visit (INDEPENDENT_AMBULATORY_CARE_PROVIDER_SITE_OTHER): Payer: BC Managed Care – PPO | Admitting: Family Medicine

## 2016-08-06 ENCOUNTER — Encounter: Payer: Self-pay | Admitting: Family Medicine

## 2016-08-06 VITALS — BP 120/72 | HR 61 | Temp 98.1°F | Wt 214.6 lb

## 2016-08-06 DIAGNOSIS — J029 Acute pharyngitis, unspecified: Secondary | ICD-10-CM | POA: Diagnosis not present

## 2016-08-06 LAB — POCT RAPID STREP A (OFFICE): RAPID STREP A SCREEN: NEGATIVE

## 2016-08-06 MED ORDER — FIRST-DUKES MOUTHWASH MT SUSP: OROMUCOSAL | 0 refills | Status: DC

## 2016-08-06 NOTE — Progress Notes (Signed)
Patient: Chris Odom Male    DOB: 11-01-1955   61 y.o.   MRN: [id number] Visit Date: 08/06/2016  Today's Provider: Vernie Murders, PA   Chief Complaint  Patient presents with  . URI   Subjective:    URI   This is a new problem. Episode onset: Saturday. The problem has been gradually worsening. There has been no fever. Associated symptoms include congestion, sinus pain and a sore throat. He has tried NSAIDs (NyQuil and DayQuil) for the symptoms.   Past Medical History:  Diagnosis Date  . Chronic kidney disease    hx stones  . COPD (chronic obstructive pulmonary disease) (HCC)    a little -due to smoking- no problems recently  . GERD (gastroesophageal reflux disease)   . Wears glasses    Past Surgical History:  Procedure Laterality Date  . CERVICAL FUSION  01/2011   C 3 -C7  . COLONOSCOPY  2009   internal hemorrhoids  . ESOPHAGOGASTRODUODENOSCOPY (EGD) WITH PROPOFOL N/A 03/17/2015   Procedure: ESOPHAGOGASTRODUODENOSCOPY (EGD) WITH PROPOFOL;  Surgeon: Lucilla Lame, MD;  Location: [place];  Service: Endoscopy;  Laterality: N/A;  . EXCISIONAL HEMORRHOIDECTOMY  2014  . FOOT NEUROMA SURGERY  10   rt  . HAND SURGERY  98   rt hand gangion, wrist ganglion  . I&D EXTREMITY Left 12/22/2012   Procedure: IRRIGATION AND DEBRIDEMENT LEFT THUMB/HAND ;  Surgeon: Tennis Must, MD;  Location: [place];  Service: Orthopedics;  Laterality: Left;  . INCISION AND DRAINAGE ABSCESS Left 12/04/2012   Procedure: INCISION AND DRAINAGE ABSCESS;  Surgeon: Tennis Must, MD;  Location: [place];  Service: Orthopedics;  Laterality: Left;  i and d np joint abcess  left thumb   . INCISION AND DRAINAGE ABSCESS Left 01/05/2013   Procedure: INCISION AND DRAINAGE LEFT THUMB;  Surgeon: Tennis Must, MD;  Location: [place];  Service: Orthopedics;  Laterality: Left;  . KIDNEY STONE SURGERY     cysto  . KNEE ARTHROSCOPY  05   ganglion cyst  .  SHOULDER ARTHROSCOPY WITH BICEPSTENOTOMY Left 12/24/2013   Procedure: SHOULDER ARTHROSCOPY WITH BICEPSTENOTOMY;  Surgeon: Renette Butters, MD;  Location: [place];  Service: Orthopedics;  Laterality: Left;  . THUMB ARTHROSCOPY  1/15   right-[facility]  . THUMB FUSION Left 01/26/15   [facility]   Family History  Problem Relation Age of Onset  . Hypertension Mother   . Diabetes Mother   . Diabetes Father    No Known Allergies   Previous Medications   OMEPRAZOLE (PRILOSEC) 20 MG CAPSULE    TAKE 1 CAPSULE BY MOUTH EVERY DAY   PANTOPRAZOLE (PROTONIX) 40 MG TABLET    Take 1 tablet (40 mg total) by mouth daily.    Review of Systems  Constitutional: Negative.   HENT: Positive for congestion, sinus pain and sore throat.   Respiratory: Negative.   Cardiovascular: Negative.     Social History  Substance Use Topics  . Smoking status: Former Smoker    Packs/day: 2.50    Years: 30.00    Quit date: 06/19/2005  . Smokeless tobacco: Never Used  . Alcohol use No   Objective:   BP 120/72 (BP Location: Right Arm, Patient Position: Sitting, Cuff Size: Normal)   Pulse 61   Temp 98.1 F (36.7 C) (Oral)   Wt 214 lb 9.6 oz (97.3 kg)   SpO2 97%   BMI 29.51 kg/m   Physical  Exam  Constitutional: He is oriented to person, place, and time. He appears well-developed and well-nourished. No distress.  HENT:  Head: Normocephalic and atraumatic.  Right Ear: Hearing and external ear normal.  Left Ear: Hearing normal.  Nose: Nose normal.  Irregular and scarred left TM with minimal hearing. Firey red posterior pharynx without exudates.   Eyes: Conjunctivae and lids are normal. Right eye exhibits no discharge. Left eye exhibits no discharge. No scleral icterus.  Neck: Neck supple.  Cardiovascular: Normal rate and regular rhythm.   Pulmonary/Chest: Effort normal and breath sounds normal. No respiratory distress.  Abdominal: Soft. Bowel sounds are normal.  Musculoskeletal:  Left thumb  stiff MCP joint from past infection and surgical fusion. Good opposition strength and flexion at the IP joint.  Lymphadenopathy:    He has no cervical adenopathy.  Neurological: He is alert and oriented to person, place, and time.  Skin: Skin is intact. No lesion and no rash noted.  Psychiatric: He has a normal mood and affect. His speech is normal and behavior is normal. Thought content normal.       Assessment & Plan:     1. Acute pharyngitis, unspecified etiology Onset with swollen sore throat sensation over the past 2-3 days. Had fever up to 99.9 on 07-05-16. Strep test negative. Treat with [employer]'s Magic Mouthwash and recheck if no better in 5-7 days. - POCT rapid strep A - Diphenhyd-Hydrocort-Nystatin (FIRST-DUKES MOUTHWASH) SUSP; Gargle and swallow 5 ml after meals and bedtime.  Dispense: 237 mL; Refill: 0

## 2016-10-07 ENCOUNTER — Encounter: Payer: Self-pay | Admitting: Family Medicine

## 2016-10-07 ENCOUNTER — Ambulatory Visit (INDEPENDENT_AMBULATORY_CARE_PROVIDER_SITE_OTHER): Payer: BC Managed Care – PPO | Admitting: Family Medicine

## 2016-10-07 VITALS — BP 110/80 | HR 56 | Temp 97.8°F | Resp 16 | Ht 72.0 in | Wt 212.8 lb

## 2016-10-07 DIAGNOSIS — R42 Dizziness and giddiness: Secondary | ICD-10-CM | POA: Diagnosis not present

## 2016-10-07 DIAGNOSIS — D171 Benign lipomatous neoplasm of skin and subcutaneous tissue of trunk: Secondary | ICD-10-CM

## 2016-10-07 DIAGNOSIS — Z1159 Encounter for screening for other viral diseases: Secondary | ICD-10-CM | POA: Diagnosis not present

## 2016-10-07 DIAGNOSIS — Z1211 Encounter for screening for malignant neoplasm of colon: Secondary | ICD-10-CM

## 2016-10-07 DIAGNOSIS — E785 Hyperlipidemia, unspecified: Secondary | ICD-10-CM | POA: Diagnosis not present

## 2016-10-07 DIAGNOSIS — R351 Nocturia: Secondary | ICD-10-CM | POA: Diagnosis not present

## 2016-10-07 DIAGNOSIS — Z0000 Encounter for general adult medical examination without abnormal findings: Secondary | ICD-10-CM

## 2016-10-07 DIAGNOSIS — K219 Gastro-esophageal reflux disease without esophagitis: Secondary | ICD-10-CM | POA: Diagnosis not present

## 2016-10-07 LAB — COMPLETE METABOLIC PANEL WITH GFR
AG Ratio: 1.8 (calc) (ref 1.0–2.5)
ALKALINE PHOSPHATASE (APISO): 69 U/L (ref 40–115)
ALT: 17 U/L (ref 9–46)
AST: 18 U/L (ref 10–35)
Albumin: 4.8 g/dL (ref 3.6–5.1)
BILIRUBIN TOTAL: 0.7 mg/dL (ref 0.2–1.2)
BUN: 13 mg/dL (ref 7–25)
CO2: 28 mmol/L (ref 20–32)
CREATININE: 0.97 mg/dL (ref 0.70–1.33)
Calcium: 10 mg/dL (ref 8.6–10.3)
Chloride: 102 mmol/L (ref 98–110)
GFR, EST AFRICAN AMERICAN: 99 mL/min/{1.73_m2} (ref >=60)
GFR, EST NON AFRICAN AMERICAN: 85 mL/min/{1.73_m2} (ref >=60)
GLUCOSE: 95 mg/dL (ref 65–99)
Globulin: 2.7 g/dL (calc) (ref 1.9–3.7)
Potassium: 4.7 mmol/L (ref 3.5–5.3)
Sodium: 140 mmol/L (ref 135–146)
Total Protein: 7.5 g/dL (ref 6.1–8.1)

## 2016-10-07 LAB — CBC WITH DIFFERENTIAL/PLATELET
Basophils Absolute: 51 cells/uL (ref 0–200)
Basophils Relative: 0.8 %
EOS ABS: 147 {cells}/uL (ref 15–500)
Eosinophils Relative: 2.3 %
HCT: 48.8 % (ref 38.5–50.0)
Hemoglobin: 16.4 g/dL (ref 13.2–17.1)
Lymphs Abs: 2022 cells/uL (ref 850–3900)
MCH: 30.4 pg (ref 27.0–33.0)
MCHC: 33.6 g/dL (ref 32.0–36.0)
MCV: 90.5 fL (ref 80.0–100.0)
MPV: 10.1 fL (ref 7.5–12.5)
Monocytes Relative: 7.5 %
NEUTROS PCT: 57.8 %
Neutro Abs: 3699 cells/uL (ref 1500–7800)
PLATELETS: 250 10*3/uL (ref 140–400)
RBC: 5.39 10*6/uL (ref 4.20–5.80)
RDW: 12.9 % (ref 11.0–15.0)
TOTAL LYMPHOCYTE: 31.6 %
WBC: 6.4 10*3/uL (ref 3.8–10.8)
WBCMIX: 480 {cells}/uL (ref 200–950)

## 2016-10-07 LAB — TSH: TSH: 1.4 m[IU]/L (ref 0.40–4.50)

## 2016-10-07 LAB — LIPID PANEL
Cholesterol: 251 mg/dL — ABNORMAL HIGH (ref <200)
HDL: 52 mg/dL (ref >40)
LDL Cholesterol (Calc): 163 mg/dL (calc) — ABNORMAL HIGH
Non-HDL Cholesterol (Calc): 199 mg/dL (calc) — ABNORMAL HIGH (ref <130)
Total CHOL/HDL Ratio: 4.8 (calc) (ref <5.0)
Triglycerides: 197 mg/dL — ABNORMAL HIGH (ref <150)

## 2016-10-07 LAB — HEPATITIS C ANTIBODY
HEP C AB: NONREACTIVE
SIGNAL TO CUT-OFF: 0.02 (ref <1.00)

## 2016-10-07 LAB — PSA: PSA: 0.9 ng/mL (ref <=4.0)

## 2016-10-07 NOTE — Progress Notes (Signed)
Patient: Chris Odom, Male    DOB: [birth year], 61 y.o.   MRN: [id number] Visit Date: 10/07/2016  Today's Provider: Vernie Murders, PA   Chief Complaint  Patient presents with  . Annual Exam  . Gastroesophageal Reflux  . Mass  . Dizziness   Subjective:    Annual physical exam Chris Odom is a 61 y.o. male who presents today for health maintenance and complete physical. He feels well. He reports exercising 3 times a week. He reports he is sleeping well. 11/14/14 CPE 11/17/14 PSA 0.9 06/30/08 colonoscopy-internal hemorrhoids -----------------------------------------------------------------  Patient requesting refill on Pantoprazole patient reports he has been of medication for about 6-8 months.   Patient C/O of lump under right axilla x's 10 days. Patient denies any pain, redness or discharge from area.   Patient C/O dizziness on and off for about 5 years only when "looking up" for example when he looks up to the sky or is on his back working on something. Patient reports dizziness usually last for several minutes and nausea lasting a day or two. Patient denies any vomiting.   Review of Systems  Constitutional: Negative.   HENT: Negative.   Eyes: Negative.   Respiratory: Negative.   Cardiovascular: Negative.   Gastrointestinal: Negative.   Endocrine: Negative.   Genitourinary: Negative.   Musculoskeletal: Negative.   Skin: Negative.   Allergic/Immunologic: Negative.   Neurological: Positive for dizziness.  Hematological: Negative.   Psychiatric/Behavioral: Negative.     Social History      He  reports that he quit smoking about 11 years ago. He has a 75.00 pack-year smoking history. He has never used smokeless tobacco. He reports that he does not drink alcohol or use drugs.       Social History   Social History  . Marital status: Married    Spouse name: N/A  . Number of children: N/A  . Years of education: N/A   Social History Main Topics  .  Smoking status: Former Smoker    Packs/day: 2.50    Years: 30.00    Quit date: 06/19/2005  . Smokeless tobacco: Never Used  . Alcohol use No  . Drug use: No  . Sexual activity: Yes   Other Topics Concern  . None   Social History Narrative  . None    Past Medical History:  Diagnosis Date  . Chronic kidney disease    hx stones  . COPD (chronic obstructive pulmonary disease) (HCC)    a little -due to smoking- no problems recently  . GERD (gastroesophageal reflux disease)   . Wears glasses      Patient Active Problem List   Diagnosis Date Noted  . Heartburn   . Reflux esophagitis   . Gastritis   . History of osteomyelitis 10/13/2014  . Abscess of hand, left 04/12/2013  . Acute osteomyelitis involving hand ([place]) 02/03/2013  . Acute osteomyelitis of hand ([place]) 02/03/2013  . 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 CERVICAL FUSION  01/2011   C 3 -C7  . COLONOSCOPY  2009   internal hemorrhoids  . ESOPHAGOGASTRODUODENOSCOPY (EGD) WITH PROPOFOL N/A 03/17/2015   Procedure: ESOPHAGOGASTRODUODENOSCOPY (EGD) WITH PROPOFOL;  Surgeon: Lucilla Lame, MD;  Location: [place];  Service: Endoscopy;  Laterality: N/A;  . EXCISIONAL HEMORRHOIDECTOMY  2014  . FOOT NEUROMA SURGERY  10   rt  . HAND SURGERY  98   rt hand  gangion, wrist ganglion  . I&D EXTREMITY Left 12/22/2012   Procedure: IRRIGATION AND DEBRIDEMENT LEFT THUMB/HAND ;  Surgeon: Tennis Must, MD;  Location: [place];  Service: Orthopedics;  Laterality: Left;  . INCISION AND DRAINAGE ABSCESS Left 12/04/2012   Procedure: INCISION AND DRAINAGE ABSCESS;  Surgeon: Tennis Must, MD;  Location: [place];  Service: Orthopedics;  Laterality: Left;  i and d np joint abcess  left thumb   . INCISION AND DRAINAGE ABSCESS Left 01/05/2013   Procedure: INCISION AND DRAINAGE LEFT THUMB;  Surgeon: Tennis Must, MD;  Location: [place];  Service: Orthopedics;  Laterality: Left;  . KIDNEY STONE SURGERY     cysto  . KNEE ARTHROSCOPY  05   ganglion cyst  . SHOULDER ARTHROSCOPY WITH BICEPSTENOTOMY Left 12/24/2013   Procedure: SHOULDER ARTHROSCOPY WITH BICEPSTENOTOMY;  Surgeon: Renette Butters, MD;  Location: [place];  Service: Orthopedics;  Laterality: Left;  . THUMB ARTHROSCOPY  1/15   right-[facility]  . THUMB FUSION Left 01/26/15   [facility]    Family History        Family Status  Relation Status  . Mother Alive  . Father Deceased  . Sister Alive  . Brother Alive  . Sister Alive        His family history includes Diabetes in his father and mother; Hypertension in his mother.     No Known Allergies  No current outpatient prescriptions on file.  Current Facility-Administered Medications:  .  0.9 %  sodium chloride infusion, , Intravenous, Continuous, Wohl, Darren, MD   Patient Care Team: Birdie Sons, MD as PCP - General (Family Medicine)      Objective:   Vitals: BP 110/80 (BP Location: Left Arm, Patient Position: Sitting, Cuff Size: Large)   Pulse (!) 56   Temp 97.8 F (36.6 C) (Oral)   Resp 16   Ht 6' (1.829 m)   Wt 212 lb 12.8 oz (96.5 kg)   SpO2 99%   BMI 28.86 kg/m    Vitals:   10/07/16 1001  BP: 110/80  Pulse: (!) 56  Resp: 16  Temp: 97.8 F (36.6 C)  TempSrc: Oral  SpO2: 99%  Weight: 212 lb 12.8 oz (96.5 kg)  Height: 6' (1.829 m)     Physical Exam  Constitutional: He is oriented to person, place, and time. He appears well-developed and well-nourished.  HENT:  Head: Normocephalic and atraumatic.  Right Ear: External ear normal.  Left Ear: External ear normal.  Nose: Nose normal.  Mouth/Throat: Oropharynx is clear and moist.  Eyes: Pupils are equal, round, and reactive to light. Conjunctivae and EOM are normal. Right eye exhibits no discharge.  Neck: Normal range of motion. Neck supple. No tracheal deviation present. No thyromegaly present.   Cardiovascular: Normal rate, regular rhythm, normal heart sounds and intact distal pulses.   No murmur heard. Pulmonary/Chest: Effort normal and breath sounds normal. No respiratory distress. He has no wheezes. He has no rales. He exhibits no tenderness.  Abdominal: Soft. He exhibits no distension and no mass. There is no tenderness. There is no rebound and no guarding.  Genitourinary:  Genitourinary Comments: Refuses exam. Last colonoscopy 06-30-08 normal by Dr. Allen Norris.  Musculoskeletal: Normal range of motion. He exhibits tenderness. He exhibits no edema.  Right second MCP joint radial side with small nodule. Well healed scars left thumb from past infection and fusion. Good bilateral grip strength.  Lymphadenopathy:  He has no cervical adenopathy.  Neurological: He is alert and oriented to person, place, and time. He has normal reflexes. No cranial nerve deficit. He exhibits normal muscle tone. Coordination normal.  Skin: Skin is warm and dry. No rash noted. No erythema.  Small lipoma right posterior axilla region. No local lymphadenopathy.  Psychiatric: He has a normal mood and affect. His behavior is normal. Judgment and thought content normal.     Depression Screen PHQ 2/9 Scores 10/07/2016 01/11/2013 12/29/2012  PHQ - 2 Score 0 0 0  PHQ- 9 Score 0 - -      Assessment & Plan:     Routine Health Maintenance and Physical Exam  Exercise Activities and Dietary recommendations Goals    Play golf 3 times a week and do a lot of yard work.      Immunization History  Administered Date(s) Administered  . Tdap 08/23/2008    Health Maintenance  Topic Date Due  . Hepatitis C Screening  05-30-1955  . HIV Screening  07/23/1972  . COLONOSCOPY  07/24/2007  . INFLUENZA VACCINE  08/21/2016  . TETANUS/TDAP  08/24/2018     Discussed health benefits of physical activity, and encouraged him to engage in regular exercise appropriate for his age and condition.      -------------------------------------------------------------------- 1. Annual physical exam General health stable. Remains active and plays golf 3 times a week. Immunizations up to date. Will get routine labs. Given anticipatory guidance. - CBC with Differential/Platelet - Comprehensive metabolic panel - Lipid panel  2. Gastroesophageal reflux disease, esophagitis presence not specified Intermittent dyspepsia with certain foods (esp. Sugars). Well controlled by use of Famotidine and Tums prn (up to twice a week). Upper endoscopies in the past were negative. Check CBC and consider consult with Dr. Allen Norris again if persistent/worsening. - CBC with Differential/Platelet  3. Screening for colon cancer Normal colonoscopy 06-30-08 by Dr. Allen Norris (GI). Due for repeat in 2020.  4. Nocturia Occasionally get up 1-2 times a night. Urine stream slightly decreased. Recheck PSA. Refuses DRE today. - PSA  5. Need for hepatitis C screening test - Hepatitis C antibody  6. Hyperlipidemia, unspecified hyperlipidemia type History of elevations of triglycerides in the past. Recheck labs today. - Comprehensive metabolic panel - Lipid panel - TSH  7. Lipoma of torso Benign small 1 cm lipoma right posterior axilla. No lymphadenopathy or tenderness. Very soft and moveable.  8. Dizziness Only occurs when looking overhead since he had cervical fusion of C3-C7 in 2013. Some history of eustachian dysfunction but no signs of recurrence now. No carotid bruits today. Check CBC and CMP for metabolic disorders. - CBC with Differential/Platelet - Comprehensive metabolic panel    Vernie Murders, PA  [facility]

## 2016-10-09 ENCOUNTER — Encounter: Payer: Self-pay | Admitting: Family Medicine

## 2016-12-24 ENCOUNTER — Ambulatory Visit: Payer: BC Managed Care – PPO | Admitting: Family Medicine

## 2016-12-24 ENCOUNTER — Encounter: Payer: Self-pay | Admitting: Family Medicine

## 2016-12-24 VITALS — BP 128/78 | HR 60 | Temp 98.0°F | Wt 217.4 lb

## 2016-12-24 DIAGNOSIS — Z114 Encounter for screening for human immunodeficiency virus [HIV]: Secondary | ICD-10-CM | POA: Diagnosis not present

## 2016-12-24 DIAGNOSIS — L813 Cafe au lait spots: Secondary | ICD-10-CM

## 2016-12-24 DIAGNOSIS — Z23 Encounter for immunization: Secondary | ICD-10-CM | POA: Diagnosis not present

## 2016-12-24 NOTE — Progress Notes (Signed)
Patient: Chris Odom Male    DOB: Nov 26, 1955   61 y.o.   MRN: [id number] Visit Date: 12/24/2016  Today's Provider: Vernie Murders, PA   Chief Complaint  Patient presents with  . Skin Problem   Subjective:    HPI Skin Changes: This is a new problem. Episode onset: couple months ago. Patient reports red spot under both arm pits. He denies pain, itching or discomfort. The problem occurs constantly. The problem has been gradually worsening. Nothing aggravates the symptoms that he is aware of. He has tried nothing for the symptoms. He also states he noticed a swollen lymph node in the right side of his neck.   Past Medical History:  Diagnosis Date  . Chronic kidney disease    hx stones  . COPD (chronic obstructive pulmonary disease) (HCC)    a little -due to smoking- no problems recently  . GERD (gastroesophageal reflux disease)   . Wears glasses    Past Surgical History:  Procedure Laterality Date  . CERVICAL FUSION  01/2011   C 3 -C7  . COLONOSCOPY  2009   internal hemorrhoids  . ESOPHAGOGASTRODUODENOSCOPY (EGD) WITH PROPOFOL N/A 03/17/2015   Procedure: ESOPHAGOGASTRODUODENOSCOPY (EGD) WITH PROPOFOL;  Surgeon: Lucilla Lame, MD;  Location: [place];  Service: Endoscopy;  Laterality: N/A;  . EXCISIONAL HEMORRHOIDECTOMY  2014  . FOOT NEUROMA SURGERY  10   rt  . HAND SURGERY  98   rt hand gangion, wrist ganglion  . I&D EXTREMITY Left 12/22/2012   Procedure: IRRIGATION AND DEBRIDEMENT LEFT THUMB/HAND ;  Surgeon: Tennis Must, MD;  Location: [place];  Service: Orthopedics;  Laterality: Left;  . INCISION AND DRAINAGE ABSCESS Left 12/04/2012   Procedure: INCISION AND DRAINAGE ABSCESS;  Surgeon: Tennis Must, MD;  Location: [place];  Service: Orthopedics;  Laterality: Left;  i and d np joint abcess  left thumb   . INCISION AND DRAINAGE ABSCESS Left 01/05/2013   Procedure: INCISION AND DRAINAGE LEFT THUMB;  Surgeon: Tennis Must,  MD;  Location: [place];  Service: Orthopedics;  Laterality: Left;  . KIDNEY STONE SURGERY     cysto  . KNEE ARTHROSCOPY  05   ganglion cyst  . SHOULDER ARTHROSCOPY WITH BICEPSTENOTOMY Left 12/24/2013   Procedure: SHOULDER ARTHROSCOPY WITH BICEPSTENOTOMY;  Surgeon: Renette Butters, MD;  Location: [place];  Service: Orthopedics;  Laterality: Left;  . THUMB ARTHROSCOPY  1/15   right[facility]  . THUMB FUSION Left 01/26/15   [facility]   Family History  Problem Relation Age of Onset  . Hypertension Mother   . Diabetes Mother   . Diabetes Father    No Known Allergies  No current outpatient medications on file.  Current Facility-Administered Medications:  .  0.9 %  sodium chloride infusion, , Intravenous, Continuous, Lucilla Lame, MD  Review of Systems  Social History   Tobacco Use  . Smoking status: Former Smoker    Packs/day: 2.50    Years: 30.00    Pack years: 75.00    Last attempt to quit: 06/19/2005    Years since quitting: 11.5  . Smokeless tobacco: Never Used  Substance Use Topics  . Alcohol use: No   Objective:   BP 128/78 (BP Location: Right Arm, Patient Position: Sitting, Cuff Size: Normal)   Pulse 60   Temp 98 F (36.7 C) (Oral)   Wt 217 lb 6.4 oz (98.6 kg)   SpO2  97%   BMI 29.48 kg/m    Physical Exam  Constitutional: He is oriented to person, place, and time. He appears well-developed and well-nourished. No distress.  HENT:  Head: Normocephalic and atraumatic.  Right Ear: Hearing normal.  Left Ear: Hearing normal.  Nose: Nose normal.  Eyes: Conjunctivae and lids are normal. Right eye exhibits no discharge. Left eye exhibits no discharge. No scleral icterus.  Pulmonary/Chest: Effort normal. No respiratory distress.  Musculoskeletal: Normal range of motion.  Neurological: He is alert and oriented to person, place, and time.  Skin: Skin is intact. No lesion and no rash noted.  One cafe-au-lait spot right axilla  approximately 1.5 cm and one on the left that is 2.5 cm in diameter. No itching or local lymphadenopathy or other rashes.  Psychiatric: He has a normal mood and affect. His speech is normal and behavior is normal. Thought content normal.      Assessment & Plan:     1. Cafe-au-lait spots Asymptomatic spots noticed in both axillae for the first time over the past few weeks. No discomfort, itching or masses. No dyspnea. Will check CBC for signs of infection with a sore spot on the right side of the neck. Questionable end of the hyoid versus lymph node. Recheck if more spots develop. May need referral to dermatologist. - CBC with Differential/Platelet  2. Screening for HIV (human immunodeficiency virus) - HIV antibody  3. Need for prophylactic vaccination with Streptococcus pneumoniae (Pneumococcus) and Influenza vaccines Refuses these vaccinations. "Don't trust the government."       Vernie Murders, PA  [facility]

## 2016-12-25 LAB — CBC WITH DIFFERENTIAL/PLATELET
Basophils Absolute: 37 cells/uL (ref 0–200)
Basophils Relative: 0.7 %
EOS ABS: 101 {cells}/uL (ref 15–500)
Eosinophils Relative: 1.9 %
HCT: 47.4 % (ref 38.5–50.0)
Hemoglobin: 16.2 g/dL (ref 13.2–17.1)
Lymphs Abs: 2003 cells/uL (ref 850–3900)
MCH: 30.5 pg (ref 27.0–33.0)
MCHC: 34.2 g/dL (ref 32.0–36.0)
MCV: 89.3 fL (ref 80.0–100.0)
MPV: 9.8 fL (ref 7.5–12.5)
Monocytes Relative: 9.7 %
NEUTROS PCT: 49.9 %
Neutro Abs: 2645 cells/uL (ref 1500–7800)
PLATELETS: 261 10*3/uL (ref 140–400)
RBC: 5.31 10*6/uL (ref 4.20–5.80)
RDW: 12.6 % (ref 11.0–15.0)
TOTAL LYMPHOCYTE: 37.8 %
WBC: 5.3 10*3/uL (ref 3.8–10.8)
WBCMIX: 514 {cells}/uL (ref 200–950)

## 2016-12-25 LAB — HIV ANTIBODY (ROUTINE TESTING W REFLEX): HIV 1&2 Ab, 4th Generation: NONREACTIVE

## 2017-01-06 ENCOUNTER — Ambulatory Visit: Payer: BC Managed Care – PPO | Admitting: Family Medicine

## 2017-03-25 ENCOUNTER — Ambulatory Visit: Payer: BC Managed Care – PPO | Admitting: Family Medicine

## 2017-04-17 ENCOUNTER — Encounter: Payer: Self-pay | Admitting: Family Medicine

## 2017-04-17 ENCOUNTER — Ambulatory Visit: Payer: BC Managed Care – PPO | Admitting: Family Medicine

## 2017-04-17 VITALS — BP 108/66 | HR 69 | Temp 98.1°F | Wt 214.8 lb

## 2017-04-17 DIAGNOSIS — R350 Frequency of micturition: Secondary | ICD-10-CM

## 2017-04-17 DIAGNOSIS — H65112 Acute and subacute allergic otitis media (mucoid) (sanguinous) (serous), left ear: Secondary | ICD-10-CM | POA: Diagnosis not present

## 2017-04-17 LAB — POCT URINALYSIS DIPSTICK
BILIRUBIN UA: NEGATIVE
GLUCOSE UA: NEGATIVE
KETONES UA: NEGATIVE
Nitrite, UA: NEGATIVE
Protein, UA: NEGATIVE
Spec Grav, UA: 1.025 (ref 1.010–1.025)
Urobilinogen, UA: 0.2 E.U./dL
pH, UA: 6 (ref 5.0–8.0)

## 2017-04-17 MED ORDER — TAMSULOSIN HCL 0.4 MG PO CAPS: 0.4000 mg | ORAL_CAPSULE | Freq: Every day | ORAL | 3 refills | Status: DC

## 2017-04-17 MED ORDER — SULFAMETHOXAZOLE-TRIMETHOPRIM 800-160 MG PO TABS: 1.0000 | ORAL_TABLET | Freq: Two times a day (BID) | ORAL | 0 refills | Status: DC

## 2017-04-17 NOTE — Patient Instructions (Signed)
Benign Prostatic Hyperplasia Benign prostatic hyperplasia (BPH) is an enlarged prostate gland that is caused by the normal aging process and not by cancer. The prostate is a walnut-sized gland that is involved in the production of semen. It is located in front of the rectum and below the bladder. The bladder stores urine and the urethra is the tube that carries the urine out of the body. The prostate may get bigger as a man gets older. An enlarged prostate can press on the urethra. This can make it harder to pass urine. The build-up of urine in the bladder can cause infection. Back pressure and infection may progress to bladder damage and kidney (renal) failure. What are the causes? This condition is part of a normal aging process. However, not all men develop problems from this condition. If the prostate enlarges away from the urethra, urine flow will not be blocked. If it enlarges toward the urethra and compresses it, there will be problems passing urine. What increases the risk? This condition is more likely to develop in men over the age of 47 years. What are the signs or symptoms? Symptoms of this condition include:  Getting up often during the night to urinate.  Needing to urinate frequently during the day.  Difficulty starting urine flow.  Decrease in size and strength of your urine stream.  Leaking (dribbling) after urinating.  Inability to pass urine. This needs immediate treatment.  Inability to completely empty your bladder.  Pain when you pass urine. This is more common if there is also an infection.  Urinary tract infection (UTI).  How is this diagnosed? This condition is diagnosed based on your medical history, a physical exam, and your symptoms. Tests will also be done, such as:  A post-void bladder scan. This measures any amount of urine that may remain in your bladder after you finish urinating.  A digital rectal exam. In a rectal exam, your health care provider  checks your prostate by putting a lubricated, gloved finger into your rectum to feel the back of your prostate gland. This exam detects the size of your gland and any abnormal lumps or growths.  An exam of your urine (urinalysis).  A prostate specific antigen (PSA) screening. This is a blood test used to screen for prostate cancer.  An ultrasound. This test uses sound waves to electronically produce a picture of your prostate gland.  Your health care provider may refer you to a specialist in kidney and prostate diseases (urologist). How is this treated? Once symptoms begin, your health care provider will monitor your condition (active surveillance or watchful waiting). Treatment for this condition will depend on the severity of your condition. Treatment may include:  Observation and yearly exams. This may be the only treatment needed if your condition and symptoms are mild.  Medicines to relieve your symptoms, including: ? Medicines to shrink the prostate. ? Medicines to relax the muscle of the prostate.  Surgery in severe cases. Surgery may include: ? Prostatectomy. In this procedure, the prostate tissue is removed completely through an open incision or with a laparascope or robotics. ? Transurethral resection of the prostate (TURP). In this procedure, a tool is inserted through the opening at the tip of the penis (urethra). It is used to cut away tissue of the inner core of the prostate. The pieces are removed through the same opening of the penis. This removes the blockage. ? Transurethral incision (TUIP). In this procedure, small cuts are made in the prostate. This  lessens the prostate's pressure on the urethra. ? Transurethral microwave thermotherapy (TUMT). This procedure uses microwaves to create heat. The heat destroys and removes a small amount of prostate tissue. ? Transurethral needle ablation (TUNA). This procedure uses radio frequencies to destroy and remove a small amount of  prostate tissue. ? Interstitial laser coagulation ([place]). This procedure uses a laser to destroy and remove a small amount of prostate tissue. ? Transurethral electrovaporization (TUVP). This procedure uses electrodes to destroy and remove a small amount of prostate tissue. ? Prostatic urethral lift. This procedure inserts an implant to push the lobes of the prostate away from the urethra.  Follow these instructions at home:  Take over-the-counter and prescription medicines only as told by your health care provider.  Monitor your symptoms for any changes. Contact your health care provider with any changes.  Avoid drinking large amounts of liquid before going to bed or out in public.  Avoid or reduce how much caffeine or alcohol you drink.  Give yourself time when you urinate.  Keep all follow-up visits as told by your health care provider. This is important. Contact a health care provider if:  You have unexplained back pain.  Your symptoms do not get better with treatment.  You develop side effects from the medicine you are taking.  Your urine becomes very dark or has a bad smell.  Your lower abdomen becomes distended and you have trouble passing your urine. Get help right away if:  You have a fever or chills.  You suddenly cannot urinate.  You feel lightheaded, or very dizzy, or you faint.  There are large amounts of blood or clots in the urine.  Your urinary problems become hard to manage.  You develop moderate to severe low back or flank pain. The flank is the side of your body between the ribs and the hip. These symptoms may represent a serious problem that is an emergency. Do not wait to see if the symptoms will go away. Get medical help right away. Call your local emergency services (911 in the [place] Do not drive yourself to the hospital. Summary  Benign prostatic hyperplasia (BPH) is an enlarged prostate that is caused by the normal aging process and not by  cancer.  An enlarged prostate can press on the urethra. This can make it hard to pass urine.  This condition is part of a normal aging process and is more likely to develop in men over the age of 54 years.  Get help right away if you suddenly cannot urinate. This information is not intended to replace advice given to you by your health care provider. Make sure you discuss any questions you have with your health care provider. Document Released: 01/07/2005 Document Revised: 02/12/2016 Document Reviewed: 02/12/2016 Elsevier Interactive Patient Education  2018 [employer]. Otitis Media, Adult Otitis media occurs when there is inflammation and fluid in the middle ear. Your middle ear is a part of the ear that contains bones for hearing as well as air that helps send sounds to your brain. What are the causes? This condition is caused by a blockage in the eustachian tube. This tube drains fluid from the ear to the back of the nose (nasopharynx). A blockage in this tube can be caused by an object or by swelling (edema) in the tube. Problems that can cause a blockage include:  A cold or other upper respiratory infection.  Allergies.  An irritant, such as tobacco smoke.  Enlarged adenoids. The adenoids  are areas of soft tissue located high in the back of the throat, behind the nose and the roof of the mouth.  A mass in the nasopharynx.  Damage to the ear caused by pressure changes (barotrauma).  What are the signs or symptoms? Symptoms of this condition include:  Ear pain.  A fever.  Decreased hearing.  A headache.  Tiredness (lethargy).  Fluid leaking from the ear.  Ringing in the ear.  How is this diagnosed? This condition is diagnosed with a physical exam. During the exam your health care provider will use an instrument called an otoscope to look into your ear and check for redness, swelling, and fluid. He or she will also ask about your symptoms. Your health care provider  may also order tests, such as:  A test to check the movement of the eardrum (pneumatic otoscopy). This test is done by squeezing a small amount of air into the ear.  A test that changes air pressure in the middle ear to check how well the eardrum moves and whether the eustachian tube is working (tympanogram).  How is this treated? This condition usually goes away on its own within 3-5 days. But if the condition is caused by a bacteria infection and does not go away own its own, or keeps coming back, your health care provider may:  Prescribe antibiotic medicines to treat the infection.  Prescribe or recommend medicines to control pain.  Follow these instructions at home:  Take over-the-counter and prescription medicines only as told by your health care provider.  If you were prescribed an antibiotic medicine, take it as told by your health care provider. Do not stop taking the antibiotic even if you start to feel better.  Keep all follow-up visits as told by your health care provider. This is important. Contact a health care provider if:  You have bleeding from your nose.  There is a lump on your neck.  You are not getting better in 5 days.  You feel worse instead of better. Get help right away if:  You have severe pain that is not controlled with medicine.  You have swelling, redness, or pain around your ear.  You have stiffness in your neck.  A part of your face is paralyzed.  The bone behind your ear (mastoid) is tender when you touch it.  You develop a severe headache. Summary  Otitis media is redness, soreness, and swelling of the middle ear.  This condition usually goes away on its own within 3-5 days.  If the problem does not go away in 3-5 days, your health care provider may prescribe or recommend medicines to treat your symptoms.  If you were prescribed an antibiotic medicine, take it as told by your health care provider. This information is not intended to  replace advice given to you by your health care provider. Make sure you discuss any questions you have with your health care provider. Document Released: 10/13/2003 Document Revised: 12/29/2015 Document Reviewed: 12/29/2015 Elsevier Interactive Patient Education  [employer].

## 2017-04-17 NOTE — Progress Notes (Signed)
Patient: Chris Odom Male    DOB: [birth year]   62 y.o.   MRN: [id number] Visit Date: 04/17/2017  Today's Provider: Vernie Murders, PA   Chief Complaint  Patient presents with  . URI  . Urinary Frequency   Subjective:    Urinary Frequency   This is a new problem. The current episode started more than 1 year ago. The problem occurs intermittently. The problem has been gradually worsening. The patient is experiencing no pain. Associated symptoms include frequency and hesitancy. He has tried nothing for the symptoms.  URI   This is a new problem. The current episode started more than 1 month ago. The problem has been gradually worsening. There has been no fever. Associated symptoms include ear pain. Associated symptoms comments: Postnasal drainage worse at night . Treatments tried: Zyrtec D. The treatment provided mild relief.   Past Medical History:  Diagnosis Date  . Chronic kidney disease    hx stones  . COPD (chronic obstructive pulmonary disease) (HCC)    a little -due to smoking- no problems recently  . GERD (gastroesophageal reflux disease)   . Wears glasses    Past Surgical History:  Procedure Laterality Date  . CERVICAL FUSION  01/2011   C 3 -C7  . COLONOSCOPY  2009   internal hemorrhoids  . ESOPHAGOGASTRODUODENOSCOPY (EGD) WITH PROPOFOL N/A 03/17/2015   Procedure: ESOPHAGOGASTRODUODENOSCOPY (EGD) WITH PROPOFOL;  Surgeon: Lucilla Lame, MD;  Location: [place];  Service: Endoscopy;  Laterality: N/A;  . EXCISIONAL HEMORRHOIDECTOMY  2014  . FOOT NEUROMA SURGERY  10   rt  . HAND SURGERY  98   rt hand gangion, wrist ganglion  . I&D EXTREMITY Left 12/22/2012   Procedure: IRRIGATION AND DEBRIDEMENT LEFT THUMB/HAND ;  Surgeon: Tennis Must, MD;  Location: [place];  Service: Orthopedics;  Laterality: Left;  . INCISION AND DRAINAGE ABSCESS Left 12/04/2012   Procedure: INCISION AND DRAINAGE ABSCESS;  Surgeon: Tennis Must, MD;  Location:  [place];  Service: Orthopedics;  Laterality: Left;  i and d np joint abcess  left thumb   . INCISION AND DRAINAGE ABSCESS Left 01/05/2013   Procedure: INCISION AND DRAINAGE LEFT THUMB;  Surgeon: Tennis Must, MD;  Location: [place];  Service: Orthopedics;  Laterality: Left;  . KIDNEY STONE SURGERY     cysto  . KNEE ARTHROSCOPY  05   ganglion cyst  . SHOULDER ARTHROSCOPY WITH BICEPSTENOTOMY Left 12/24/2013   Procedure: SHOULDER ARTHROSCOPY WITH BICEPSTENOTOMY;  Surgeon: Renette Butters, MD;  Location: [place];  Service: Orthopedics;  Laterality: Left;  . THUMB ARTHROSCOPY  1/15   right-[facility]  . THUMB FUSION Left 01/26/15   [facility]   Family History  Problem Relation Age of Onset  . Hypertension Mother   . Diabetes Mother   . Diabetes Father    No Known Allergies  Current Outpatient Medications:  .  cetirizine-pseudoephedrine (ZYRTEC-D) 5-120 MG tablet, Take 1 tablet by mouth at bedtime., Disp: , Rfl:  .  Cyanocobalamin (VITAMIN B-12) 5000 MCG TBDP, Take by mouth., Disp: , Rfl:  .  Melatonin 5 MG TABS, Take by mouth., Disp: , Rfl:  .  Multiple Vitamins-Minerals (MULTIVITAMIN MEN PO), Take 2,000 Units by mouth daily., Disp: , Rfl:  .  omeprazole (PRILOSEC) 20 MG capsule, Take 20 mg by mouth daily., Disp: , Rfl:   Review of Systems  Constitutional: Negative.   HENT: Positive  for ear pain, hearing loss and postnasal drip.   Respiratory: Negative.   Cardiovascular: Negative.   Genitourinary: Positive for frequency and hesitancy.   Social History   Tobacco Use  . Smoking status: Former Smoker    Packs/day: 2.50    Years: 30.00    Pack years: 75.00    Last attempt to quit: 06/19/2005    Years since quitting: 11.8  . Smokeless tobacco: Never Used  Substance Use Topics  . Alcohol use: No   Objective:   BP 108/66 (BP Location: Right Arm, Patient Position: Sitting, Cuff Size: Normal)   Pulse 69   Temp 98.1 F (36.7 C)  (Oral)   Wt 214 lb 12.8 oz (97.4 kg)   SpO2 97%   BMI 29.13 kg/m   Physical Exam  Constitutional: He is oriented to person, place, and time. He appears well-developed and well-nourished. No distress.  HENT:  Head: Normocephalic and atraumatic.  Right Ear: Hearing normal.  Left Ear: Hearing normal.  Nose: Nose normal.  Mouth/Throat: Oropharynx is clear and moist.  Both TM's yellow white with air bubble behind the left.   Eyes: Conjunctivae and lids are normal. Right eye exhibits no discharge. Left eye exhibits no discharge. No scleral icterus.  Neck: Neck supple.  Cardiovascular: Normal rate and regular rhythm.  Pulmonary/Chest: Effort normal and breath sounds normal. No respiratory distress.  Abdominal: Soft. Bowel sounds are normal.  Genitourinary: Prostate normal. Rectal exam shows guaiac negative stool.  Neurological: He is alert and oriented to person, place, and time.  Skin: Skin is intact. No lesion and no rash noted.  Psychiatric: He has a normal mood and affect. His speech is normal and behavior is normal. Thought content normal.      Assessment & Plan:     1. Urinary frequency Over the past month, having extra troubles with getting up 1-2 times a night, dribbling after urinating, hesitancy and decreased stream. No nodule or hardness of prostate. PSA was 0.9 on 11-17-15 and 10-07-16. Urinalysis shows some RBC's and WBC's to a minor degree per hpf. Will get urine culture, start Septra-DS and given Flomax to help with urinary symptoms. Recheck pending C&S report. - POCT Urinalysis Dipstick - Urine Culture - sulfamethoxazole-trimethoprim (BACTRIM DS,SEPTRA DS) 800-160 MG tablet; Take 1 tablet by mouth 2 (two) times daily.  Dispense: 28 tablet; Refill: 0 - tamsulosin (FLOMAX) 0.4 MG CAPS capsule; Take 1 capsule (0.4 mg total) by mouth daily.  Dispense: 30 capsule; Refill: 3  2. Acute mucoid otitis media of left ear Over the past month, having intermittent stopped up sensation  with worsening of hearing. History of tinnitus since his service in the [facility]. Recommend trying Lipo-Flavonoid supplement and add Septra-DS with Zyrtec and Flonase. Recheck as needed. May need to consider recheck with ENT. - sulfamethoxazole-trimethoprim (BACTRIM DS,SEPTRA DS) 800-160 MG tablet; Take 1 tablet by mouth 2 (two) times daily.  Dispense: 28 tablet; Refill: [place], PA  [facility]

## 2017-04-19 LAB — URINE CULTURE

## 2017-08-22 ENCOUNTER — Other Ambulatory Visit: Payer: Self-pay | Admitting: Family Medicine

## 2017-08-22 DIAGNOSIS — R350 Frequency of micturition: Secondary | ICD-10-CM

## 2017-09-26 ENCOUNTER — Other Ambulatory Visit: Payer: Self-pay | Admitting: Family Medicine

## 2017-09-26 DIAGNOSIS — R350 Frequency of micturition: Secondary | ICD-10-CM

## 2017-10-29 ENCOUNTER — Other Ambulatory Visit: Payer: Self-pay | Admitting: Family Medicine

## 2017-10-29 DIAGNOSIS — R350 Frequency of micturition: Secondary | ICD-10-CM

## 2017-10-29 NOTE — Telephone Encounter (Signed)
Will forward to PCP  Marin Milley, Dionne Bucy, MD, MPH [facility] 10/29/2017 4:19 PM

## 2018-04-14 ENCOUNTER — Other Ambulatory Visit: Payer: Self-pay

## 2018-04-14 DIAGNOSIS — R350 Frequency of micturition: Secondary | ICD-10-CM

## 2018-04-14 MED ORDER — TAMSULOSIN HCL 0.4 MG PO CAPS: ORAL_CAPSULE | ORAL | 0 refills | Status: DC

## 2018-04-14 NOTE — Telephone Encounter (Signed)
Will refill Tamsulosin once. Need to recheck labs to be sure kidney function normal since we have not seen him in a year. Could be seen when we get blood tests drawn or plan a Webex appointment to see if Xanax is appropriate.

## 2018-04-14 NOTE — Telephone Encounter (Signed)
Patient is requesting a refill on Flomax and would like to started taking Xanax due to increase anxiety per patient.Please advise.

## 2018-04-16 ENCOUNTER — Other Ambulatory Visit: Payer: Self-pay

## 2018-04-16 ENCOUNTER — Encounter: Payer: Self-pay | Admitting: Family Medicine

## 2018-04-16 ENCOUNTER — Ambulatory Visit (INDEPENDENT_AMBULATORY_CARE_PROVIDER_SITE_OTHER): Payer: BC Managed Care – PPO | Admitting: Family Medicine

## 2018-04-16 VITALS — BP 110/58 | HR 81 | Temp 98.2°F | Resp 16 | Wt 197.0 lb

## 2018-04-16 DIAGNOSIS — F430 Acute stress reaction: Secondary | ICD-10-CM | POA: Diagnosis not present

## 2018-04-16 DIAGNOSIS — J301 Allergic rhinitis due to pollen: Secondary | ICD-10-CM

## 2018-04-16 DIAGNOSIS — F411 Generalized anxiety disorder: Secondary | ICD-10-CM | POA: Diagnosis not present

## 2018-04-16 MED ORDER — FLUTICASONE PROPIONATE 50 MCG/ACT NA SUSP: 2.0000 | Freq: Every day | NASAL | 6 refills | Status: DC

## 2018-04-16 MED ORDER — TRAZODONE HCL 50 MG PO TABS: 25.0000 mg | ORAL_TABLET | Freq: Every day | ORAL | 3 refills | Status: AC
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
## 2018-04-17 LAB — COMPREHENSIVE METABOLIC PANEL
ALBUMIN: 4.7 g/dL (ref 3.8–4.9)
ALK PHOS: 64 IU/L (ref 39–117)
ALT: 9 IU/L (ref 0–44)
AST: 12 IU/L (ref 0–40)
Albumin/Globulin Ratio: 2.1 (ref 1.2–2.2)
BUN / CREAT RATIO: 19 (ref 10–24)
BUN: 19 mg/dL (ref 8–27)
Bilirubin Total: 0.3 mg/dL (ref 0.0–1.2)
CALCIUM: 10 mg/dL (ref 8.6–10.2)
CO2: 23 mmol/L (ref 20–29)
CREATININE: 0.99 mg/dL (ref 0.76–1.27)
Chloride: 104 mmol/L (ref 96–106)
GFR calc Af Amer: 95 mL/min/{1.73_m2} (ref >59)
GFR, EST NON AFRICAN AMERICAN: 82 mL/min/{1.73_m2} (ref >59)
GLOBULIN, TOTAL: 2.2 g/dL (ref 1.5–4.5)
GLUCOSE: 87 mg/dL (ref 65–99)
Potassium: 4 mmol/L (ref 3.5–5.2)
SODIUM: 143 mmol/L (ref 134–144)
TOTAL PROTEIN: 6.9 g/dL (ref 6.0–8.5)

## 2018-04-17 LAB — CBC WITH DIFFERENTIAL/PLATELET
BASOS ABS: 0 10*3/uL (ref 0.0–0.2)
Basos: 1 %
EOS (ABSOLUTE): 0.1 10*3/uL (ref 0.0–0.4)
EOS: 1 %
HEMATOCRIT: 45 % (ref 37.5–51.0)
Hemoglobin: 15.1 g/dL (ref 13.0–17.7)
IMMATURE GRANS (ABS): 0 10*3/uL (ref 0.0–0.1)
IMMATURE GRANULOCYTES: 0 %
LYMPHS: 37 %
Lymphocytes Absolute: 2.3 10*3/uL (ref 0.7–3.1)
MCH: 29.5 pg (ref 26.6–33.0)
MCHC: 33.6 g/dL (ref 31.5–35.7)
MCV: 88 fL (ref 79–97)
MONOCYTES: 8 %
Monocytes Absolute: 0.5 10*3/uL (ref 0.1–0.9)
Neutrophils Absolute: 3.2 10*3/uL (ref 1.4–7.0)
Neutrophils: 53 %
Platelets: 244 10*3/uL (ref 150–450)
RBC: 5.12 x10E6/uL (ref 4.14–5.80)
RDW: 13.3 % (ref 11.6–15.4)
WBC: 6.1 10*3/uL (ref 3.4–10.8)

## 2018-04-17 LAB — TSH: TSH: 1.49 u[IU]/mL (ref 0.450–4.500)

## 2018-04-17 NOTE — Telephone Encounter (Signed)
Pt advised of message below.  All meds have been picked up from pharmacy.  dbs

## 2018-05-21 ENCOUNTER — Other Ambulatory Visit: Payer: Self-pay

## 2018-05-21 ENCOUNTER — Ambulatory Visit (INDEPENDENT_AMBULATORY_CARE_PROVIDER_SITE_OTHER): Payer: BC Managed Care – PPO | Admitting: Family Medicine

## 2018-05-21 ENCOUNTER — Encounter: Payer: Self-pay | Admitting: Family Medicine

## 2018-05-21 VITALS — BP 100/60 | HR 61 | Temp 98.1°F | Wt 198.0 lb

## 2018-05-21 DIAGNOSIS — S90425A Blister (nonthermal), left lesser toe(s), initial encounter: Secondary | ICD-10-CM

## 2018-05-21 NOTE — Progress Notes (Signed)
Chris Odom  MRN: [id number] DOB: 1955-06-03  Subjective:  HPI   The patient is a 63 year old male who presents for evaluation of a growth he has noted on his left foot.  He states it has been present for 2 weeks .  There is no redness, drainage, or soreness.  Patient Active Problem List   Diagnosis Date Noted  . Heartburn   . Reflux esophagitis   . Gastritis   . History of osteomyelitis 10/13/2014  . Abscess of hand, left 04/12/2013  . Acute osteomyelitis involving hand ([place]) 02/03/2013  . Acute osteomyelitis of hand ([place]) 02/03/2013  . 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 Chronic kidney disease    hx stones  . COPD (chronic obstructive pulmonary disease) (HCC)    a little -due to smoking- no problems recently  . GERD (gastroesophageal reflux disease)   . Wears glasses    Past Surgical History:  Procedure Laterality Date  . CERVICAL FUSION  01/2011   C 3 -C7  . COLONOSCOPY  2009   internal hemorrhoids  . ESOPHAGOGASTRODUODENOSCOPY (EGD) WITH PROPOFOL N/A 03/17/2015   Procedure: ESOPHAGOGASTRODUODENOSCOPY (EGD) WITH PROPOFOL;  Surgeon: Lucilla Lame, MD;  Location: [place];  Service: Endoscopy;  Laterality: N/A;  . EXCISIONAL HEMORRHOIDECTOMY  2014  . FOOT NEUROMA SURGERY  10   rt  . HAND SURGERY  98   rt hand gangion, wrist ganglion  . I&D EXTREMITY Left 12/22/2012   Procedure: IRRIGATION AND DEBRIDEMENT LEFT THUMB/HAND ;  Surgeon: Tennis Must, MD;  Location: [place];  Service: Orthopedics;  Laterality: Left;  . INCISION AND DRAINAGE ABSCESS Left 12/04/2012   Procedure: INCISION AND DRAINAGE ABSCESS;  Surgeon: Tennis Must, MD;  Location: [place];  Service: Orthopedics;  Laterality: Left;  i and d np joint abcess  left thumb   . INCISION AND DRAINAGE ABSCESS Left 01/05/2013   Procedure: INCISION AND DRAINAGE LEFT THUMB;  Surgeon: Tennis Must, MD;  Location: [place];  Service: Orthopedics;  Laterality: Left;  . KIDNEY STONE SURGERY     cysto  . KNEE ARTHROSCOPY  05   ganglion cyst  . SHOULDER ARTHROSCOPY WITH BICEPSTENOTOMY Left 12/24/2013   Procedure: SHOULDER ARTHROSCOPY WITH BICEPSTENOTOMY;  Surgeon: Renette Butters, MD;  Location: [place];  Service: Orthopedics;  Laterality: Left;  . THUMB ARTHROSCOPY  1/15   right-[facility]  . THUMB FUSION Left 01/26/15   [facility]   Family History  Problem Relation Age of Onset  . Hypertension Mother   . Diabetes Mother   . Diabetes Father    Social History   Socioeconomic History  . Marital status: Divorced    Spouse name: Not on file  . Number of children: Not on file  . Years of education: Not on file  . Highest education level: Not on file  Occupational History  . Not on file  Social Needs  . Financial resource strain: Not on file  . Food insecurity:    Worry: Not on file    Inability: Not on file  . Transportation needs:    Medical: Not on file    Non-medical: Not on file  Tobacco Use  . Smoking status: Former Smoker    Packs/day: 2.50    Years: 30.00    Pack years: 75.00    Last attempt to quit: 06/19/2005    Years  since quitting: 12.9  . Smokeless tobacco: Never Used  Substance and Sexual Activity  . Alcohol use: No  . Drug use: No  . Sexual activity: Yes  Lifestyle  . Physical activity:    Days per week: Not on file    Minutes per session: Not on file  . Stress: Not on file  Relationships  . Social connections:    Talks on phone: Not on file    Gets together: Not on file    Attends religious service: Not on file    Active member of club or organization: Not on file    Attends meetings of clubs or organizations: Not on file    Relationship status: Not on file  . Intimate partner violence:    Fear of current or ex partner: Not on file    Emotionally abused: Not on file    Physically abused: Not on file    Forced sexual activity: Not on  file  Other Topics Concern  . Not on file  Social History Narrative  . Not on file    Outpatient Encounter Medications as of 05/21/2018  Medication Sig  . Cyanocobalamin (VITAMIN B-12) 5000 MCG TBDP Take by mouth.  . fluticasone (FLONASE) 50 MCG/ACT nasal spray Place 2 sprays into both nostrils daily.  Marland Kitchen omeprazole (PRILOSEC) 20 MG capsule Take 20 mg by mouth daily.  . tamsulosin (FLOMAX) 0.4 MG CAPS capsule TAKE 1 CAPSULE(0.4 MG) BY MOUTH DAILY  . diphenhydrAMINE HCl, Sleep, (ZZZQUIL PO) Take by mouth.  . Melatonin 5 MG TABS Take by mouth.  . Multiple Vitamins-Minerals (MULTIVITAMIN MEN PO) Take 2,000 Units by mouth daily.  . traZODone (DESYREL) 50 MG tablet Take 0.5-1 tablets (25-50 mg total) by mouth daily. For sleep and anxiety. (Patient not taking: Reported on 05/21/2018)   Facility-Administered Encounter Medications as of 05/21/2018  Medication  . 0.9 %  sodium chloride infusion    No Known Allergies  Review of Systems  Skin: Negative for itching and rash.    Objective:  BP 100/60 (BP Location: Right Arm, Patient Position: Sitting, Cuff Size: Normal)   Pulse 61   Temp 98.1 F (36.7 C) (Oral)   Wt 198 lb (89.8 kg)   SpO2 96%   BMI 26.85 kg/m   Physical Exam  Constitutional: He is oriented to person, place, and time and well-developed, well-nourished, and in no distress.  HENT:  Head: Normocephalic.  Eyes: Conjunctivae are normal.  Neck: Neck supple.  Pulmonary/Chest: Effort normal.  Abdominal: Soft.  Musculoskeletal:        General: No tenderness or edema.     Comments: Stiffness/decrease in ROM of the left thumb with healed scars from past multiple procedures due to osteomyelitis. No sign of infection in any digit.  Neurological: He is alert and oriented to person, place, and time.  Skin: No rash noted.  Psychiatric: Mood, affect and judgment normal.    Assessment and Plan :   1. Blister of fifth toe of left foot, initial encounter Has noticed a scab on the  tip of the left 5th toe over the past 2 weeks. No known injury or pain. Appears to be a healing blister. No deformity of the toe or sign of infection. May use warm Epsom Saltwater soaks prn. Recheck if not cleared in a week.

## 2018-07-27 ENCOUNTER — Other Ambulatory Visit: Payer: Self-pay | Admitting: Family Medicine

## 2018-07-27 DIAGNOSIS — R350 Frequency of micturition: Secondary | ICD-10-CM

## 2018-07-27 NOTE — Telephone Encounter (Signed)
Please review

## 2018-10-30 ENCOUNTER — Other Ambulatory Visit: Payer: Self-pay | Admitting: Family Medicine

## 2018-10-30 DIAGNOSIS — R350 Frequency of micturition: Secondary | ICD-10-CM

## 2018-12-14 ENCOUNTER — Telehealth: Payer: Self-pay

## 2018-12-14 NOTE — Telephone Encounter (Signed)
Need to evaluate the ears and sinuses to be sure no COVID testing needed. Also, need to document need for referral so insurance would cover referral.

## 2018-12-14 NOTE — Telephone Encounter (Signed)
Copied from [place] [phone]. Topic: Referral - Request for Referral >> Dec 14, 2018  3:06 PM Virl Axe D wrote: Has patient seen PCP for this complaint? Yes but not recently *If NO, is insurance requiring patient see PCP for this issue before PCP can refer them? Referral for which specialty: ENT Preferred provider/office: [facility] [place], [place], [place] [address] / [phone] Reason for referral: Fluid in ears, sinuses

## 2018-12-15 NOTE — Telephone Encounter (Signed)
LMTCB, patient will need virtual visit for documentation to justify referral.  Also ENT will not see patients without being Covid tested first

## 2018-12-18 ENCOUNTER — Encounter: Payer: Self-pay | Admitting: Family Medicine

## 2018-12-18 ENCOUNTER — Ambulatory Visit (INDEPENDENT_AMBULATORY_CARE_PROVIDER_SITE_OTHER): Payer: BC Managed Care – PPO | Admitting: Family Medicine

## 2018-12-18 DIAGNOSIS — R42 Dizziness and giddiness: Secondary | ICD-10-CM | POA: Diagnosis not present

## 2018-12-18 MED ORDER — MECLIZINE HCL 12.5 MG PO TABS: ORAL_TABLET | ORAL | 0 refills | Status: AC

## 2018-12-18 NOTE — Progress Notes (Signed)
Patient: Chris Odom Male    DOB: Nov 20, 1955   63 y.o.   MRN: [id number] Visit Date: 12/18/2018  Today's Provider: Vernie Murders, PA    Subjective:    Virtual Visit via Video Note  I connected with [name] on 12/18/18 at 10:00 AM EST by a video enabled telemedicine application and verified that I am speaking with the correct person using two identifiers.  Location: Patient: Home Provider: Office   I discussed the limitations of evaluation and management by telemedicine and the availability of in person appointments. The patient expressed understanding and agreed to proceed.  HPI: This 63 year old male has had 3 episodes of vertigo since June 2020. They last from 4-8 hours and have left ear stopped up sensation, nausea, vomiting and severe spinning sensation with each episode. Denies any fever, cough, shortness of breath, body aches, sore throat or nasal congestion. States he has a 10% hearing loss from [employer].   Past Medical History:  Diagnosis Date  . Chronic kidney disease    hx stones  . COPD (chronic obstructive pulmonary disease) (HCC)    a little -due to smoking- no problems recently  . GERD (gastroesophageal reflux disease)   . Wears glasses    Past Surgical History:  Procedure Laterality Date  . CERVICAL FUSION  01/2011   C 3 -C7  . COLONOSCOPY  2009   internal hemorrhoids  . ESOPHAGOGASTRODUODENOSCOPY (EGD) WITH PROPOFOL N/A 03/17/2015   Procedure: ESOPHAGOGASTRODUODENOSCOPY (EGD) WITH PROPOFOL;  Surgeon: Lucilla Lame, MD;  Location: [place];  Service: Endoscopy;  Laterality: N/A;  . EXCISIONAL HEMORRHOIDECTOMY  2014  . FOOT NEUROMA SURGERY  10   rt  . HAND SURGERY  98   rt hand gangion, wrist ganglion  . I&D EXTREMITY Left 12/22/2012   Procedure: IRRIGATION AND DEBRIDEMENT LEFT THUMB/HAND ;  Surgeon: Tennis Must, MD;  Location: [place];  Service: Orthopedics;  Laterality: Left;  . INCISION AND  DRAINAGE ABSCESS Left 12/04/2012   Procedure: INCISION AND DRAINAGE ABSCESS;  Surgeon: Tennis Must, MD;  Location: [place];  Service: Orthopedics;  Laterality: Left;  i and d np joint abcess  left thumb   . INCISION AND DRAINAGE ABSCESS Left 01/05/2013   Procedure: INCISION AND DRAINAGE LEFT THUMB;  Surgeon: Tennis Must, MD;  Location: [place];  Service: Orthopedics;  Laterality: Left;  . KIDNEY STONE SURGERY     cysto  . KNEE ARTHROSCOPY  05   ganglion cyst  . SHOULDER ARTHROSCOPY WITH BICEPSTENOTOMY Left 12/24/2013   Procedure: SHOULDER ARTHROSCOPY WITH BICEPSTENOTOMY;  Surgeon: Renette Butters, MD;  Location: [place];  Service: Orthopedics;  Laterality: Left;  . THUMB ARTHROSCOPY  1/15   right-[facility]  . THUMB FUSION Left 01/26/15   [facility]   Family History  Problem Relation Age of Onset  . Hypertension Mother   . Diabetes Mother   . Diabetes Father    No Known Allergies  Current Outpatient Medications:  .  Cyanocobalamin (VITAMIN B-12) 5000 MCG TBDP, Take by mouth., Disp: , Rfl:  .  diphenhydrAMINE HCl, Sleep, (ZZZQUIL PO), Take by mouth., Disp: , Rfl:  .  fluticasone (FLONASE) 50 MCG/ACT nasal spray, Place 2 sprays into both nostrils daily., Disp: 16 g, Rfl: 6 .  Melatonin 5 MG TABS, Take by mouth., Disp: , Rfl:  .  Multiple Vitamins-Minerals (MULTIVITAMIN MEN PO), Take 2,000 Units by  mouth daily., Disp: , Rfl:  .  omeprazole (PRILOSEC) 20 MG capsule, Take 20 mg by mouth daily., Disp: , Rfl:  .  tamsulosin (FLOMAX) 0.4 MG CAPS capsule, TAKE ONE CAPSULE BY MOUTH DAILY, Disp: 90 capsule, Rfl: 3 .  traZODone (DESYREL) 50 MG tablet, Take 0.5-1 tablets (25-50 mg total) by mouth daily. For sleep and anxiety. (Patient not taking: Reported on 05/21/2018), Disp: 30 tablet, Rfl: 3  Current Facility-Administered Medications:  .  0.9 %  sodium chloride infusion, , Intravenous, Continuous, Lucilla Lame, MD  Review of Systems   Constitutional: Negative.   HENT: Positive for hearing loss. Negative for congestion, ear pain, postnasal drip and sore throat.   Respiratory: Negative.   Gastrointestinal: Positive for nausea and vomiting.       Only during vertigo symptoms.  Neurological: Positive for dizziness.   Social History   Tobacco Use  . Smoking status: Former Smoker    Packs/day: 2.50    Years: 30.00    Pack years: 75.00    Quit date: 06/19/2005    Years since quitting: 13.5  . Smokeless tobacco: Never Used  Substance Use Topics  . Alcohol use: No     Objective:   There were no vitals taken for this visit. There were no vitals filed for this visit.There is no height or weight on file to calculate BMI.  WDWN male in no apparent distress.  Head: Normocephalic, atraumatic. Neck: Supple, NROM Respiratory: No apparent distress Psych: Normal mood and affect    Assessment & Plan     1. Vertigo Has had recurrent episodes of severe vertigo with nausea and vomiting since June 2020. Each episode has lasted 4-8 hours and requests referral to an ENT in the area he has moved to recently [place], [place]). Denies fever, cough, fatigue, body aches, loss of taste or smell and has not had exposure to anyone positive for COVID-19. Will treat with Antivert and try to schedule ENT referral in his area. Counseled regarding COVID symptoms and should get tested if symptoms occur. Probably should consider getting established with primary care in [place], [place]. - meclizine (ANTIVERT) 12.5 MG tablet; Take 1-2 tablets by mouth 3 times a day as needed for vertigo and nausea.  Dispense: 30 tablet; Refill: 0 - Ambulatory referral to ENT  I discussed the assessment and treatment plan with the patient. The patient was provided an opportunity to ask questions and all were answered. The patient agreed with the plan and demonstrated an understanding of the instructions.   The patient was advised to call back or seek an in-person  evaluation if the symptoms worsen or if the condition fails to improve as anticipated.  I provided 15 minutes of non-face-to-face time during this encounter.     Vernie Murders, PA  [facility]

## 2019-03-23 DIAGNOSIS — H8102 Meniere's disease, left ear: Secondary | ICD-10-CM | POA: Insufficient documentation

## 2019-03-23 DIAGNOSIS — H903 Sensorineural hearing loss, bilateral: Secondary | ICD-10-CM | POA: Insufficient documentation

## 2019-05-14 DIAGNOSIS — L578 Other skin changes due to chronic exposure to nonionizing radiation: Secondary | ICD-10-CM | POA: Insufficient documentation

## 2019-05-14 DIAGNOSIS — Z872 Personal history of diseases of the skin and subcutaneous tissue: Secondary | ICD-10-CM | POA: Insufficient documentation

## 2019-06-13 ENCOUNTER — Other Ambulatory Visit: Payer: Self-pay | Admitting: Family Medicine

## 2019-06-13 DIAGNOSIS — J301 Allergic rhinitis due to pollen: Secondary | ICD-10-CM

## 2019-06-13 NOTE — Telephone Encounter (Signed)
Requested Prescriptions  Pending Prescriptions Disp Refills  . fluticasone (FLONASE) 50 MCG/ACT nasal spray [Pharmacy Med Name: FLUTICASONE 50MCG NASAL SP (120) RX] 48 g 3    Sig: USE 2 SPRAYS IN EACH NOSTRIL DAILY     Ear, Nose, and Throat: Nasal Preparations - Corticosteroids Passed - 06/13/2019  3:39 AM      Passed - Valid encounter within last 12 months    Recent Outpatient Visits          5 months ago [place], [place]   1 year ago Blister of fifth toe of left foot, initial encounter   [facility], [place], [place]   1 year ago Anxiety in acute stress reaction   [facility], [place], [place]   2 years ago Urinary frequency   [facility], [place], [place]   2 years ago [place] [place]   [facility], [place], [place]

## 2019-10-30 ENCOUNTER — Other Ambulatory Visit: Payer: Self-pay | Admitting: Family Medicine

## 2019-10-30 DIAGNOSIS — R350 Frequency of micturition: Secondary | ICD-10-CM

## 2019-11-09 ENCOUNTER — Other Ambulatory Visit: Payer: Self-pay | Admitting: Family Medicine

## 2019-11-09 DIAGNOSIS — R350 Frequency of micturition: Secondary | ICD-10-CM

## 2020-01-10 ENCOUNTER — Other Ambulatory Visit: Payer: Self-pay | Admitting: Family Medicine

## 2020-01-10 DIAGNOSIS — R350 Frequency of micturition: Secondary | ICD-10-CM

## 2020-01-10 NOTE — Telephone Encounter (Signed)
Requested medication (s) are due for refill today: yes   Requested medication (s) are on the active medication list: yes  Last refill:  10/30/2019  Future visit scheduled: no  Notes to clinic:  overdue for follow up appt VM left for patient    Requested Prescriptions  Pending Prescriptions Disp Refills   tamsulosin (FLOMAX) 0.4 MG CAPS capsule [Pharmacy Med Name: TAMSULOSIN 0.4MG  CAPSULES] 60 capsule     Sig: TAKE 1 CAPSULE BY MOUTH DAILY      Urology: Alpha-Adrenergic Blocker Failed - 01/10/2020 11:28 AM      Failed - Valid encounter within last 12 months    Recent Outpatient Visits           1 year ago [place], PA-C   1 year ago Blister of fifth toe of left foot, initial encounter   [facility], [place] Muff, PA-C   1 year ago Anxiety in acute stress reaction   [facility], [place] Muff, PA-C   2 years ago Urinary frequency   [facility], [place] Muff, PA-C   3 years ago [place] [facility]   [place], PA-C                Passed - Last BP in normal range    BP Readings from Last 1 Encounters:  05/21/18 100/60

## 2020-04-11 ENCOUNTER — Other Ambulatory Visit: Payer: Self-pay | Admitting: Family Medicine

## 2020-04-11 DIAGNOSIS — R350 Frequency of micturition: Secondary | ICD-10-CM

## 2020-04-11 NOTE — Telephone Encounter (Signed)
Requested medications are due for refill today yes  Requested medications are on the active medication list yes  Last refill 12/2019  Last visit 11/2018  Future visit scheduled no  Notes to clinic Has already had a curtesy refill and there is no upcoming appointment scheduled.

## 2020-07-17 ENCOUNTER — Other Ambulatory Visit: Payer: Self-pay | Admitting: Family Medicine

## 2020-07-17 DIAGNOSIS — R350 Frequency of micturition: Secondary | ICD-10-CM

## 2020-07-17 NOTE — Telephone Encounter (Signed)
Requested medication (s) are due for refill today: no  Requested medication (s) are on the active medication list: yes   Last refill:  04/10/2020  Future visit scheduled : no  Notes to clinic:  overdue for office visit Message has been sent to patient to contact office Review for short supply    Requested Prescriptions  Pending Prescriptions Disp Refills   tamsulosin (FLOMAX) 0.4 MG CAPS capsule [Pharmacy Med Name: TAMSULOSIN 0.4MG  CAPSULES] 60 capsule 0    Sig: TAKE 1 CAPSULE BY MOUTH DAILY      Urology: Alpha-Adrenergic Blocker Failed - 07/17/2020  9:24 AM      Failed - Valid encounter within last 12 months    Recent Outpatient Visits           1 year ago [place], PA-C   2 years ago Blister of fifth toe of left foot, initial encounter   [facility], [place]ki Muff, PA-C   2 years ago Anxiety in acute stress reaction   [facility], [place]ickki Muff, PA-C   3 years ago Urinary frequency   [facility], [place]ickki Muff, PA-C   3 years ago [place]   [place], PA-C                Passed - Last BP in normal range    BP Readings from Last 1 Encounters:  05/21/18 100/60
